# Patient Record
Sex: Female | Born: 1950 | Race: White | Hispanic: No | Marital: Married | Smoking: Never smoker
Health system: Southern US, Community
[De-identification: ages and names within clinical notes are randomized; demographics above are authoritative.]

## PROBLEM LIST (undated history)

## (undated) DIAGNOSIS — I5189 Other ill-defined heart diseases: Secondary | ICD-10-CM

## (undated) DIAGNOSIS — D6851 Activated protein C resistance: Secondary | ICD-10-CM

## (undated) DIAGNOSIS — I10 Essential (primary) hypertension: Secondary | ICD-10-CM

## (undated) DIAGNOSIS — G43909 Migraine, unspecified, not intractable, without status migrainosus: Secondary | ICD-10-CM

## (undated) DIAGNOSIS — R911 Solitary pulmonary nodule: Secondary | ICD-10-CM

## (undated) DIAGNOSIS — Z0389 Encounter for observation for other suspected diseases and conditions ruled out: Secondary | ICD-10-CM

## (undated) DIAGNOSIS — IMO0001 Reserved for inherently not codable concepts without codable children: Secondary | ICD-10-CM

## (undated) DIAGNOSIS — E785 Hyperlipidemia, unspecified: Secondary | ICD-10-CM

## (undated) DIAGNOSIS — M797 Fibromyalgia: Secondary | ICD-10-CM

## (undated) DIAGNOSIS — G2581 Restless legs syndrome: Secondary | ICD-10-CM

## (undated) HISTORY — DX: Solitary pulmonary nodule: R91.1

## (undated) HISTORY — PX: CHOLECYSTECTOMY: SHX55

## (undated) HISTORY — PX: ABDOMINAL SURGERY: SHX537

## (undated) HISTORY — DX: Other ill-defined heart diseases: I51.89

## (undated) HISTORY — DX: Encounter for observation for other suspected diseases and conditions ruled out: Z03.89

## (undated) HISTORY — DX: Migraine, unspecified, not intractable, without status migrainosus: G43.909

## (undated) HISTORY — PX: APPENDECTOMY: SHX54

## (undated) HISTORY — PX: ABDOMINAL HYSTERECTOMY: SHX81

## (undated) HISTORY — DX: Reserved for inherently not codable concepts without codable children: IMO0001

---

## 2015-06-19 ENCOUNTER — Encounter (HOSPITAL_COMMUNITY): Payer: Self-pay | Admitting: *Deleted

## 2015-06-19 ENCOUNTER — Observation Stay (HOSPITAL_COMMUNITY)
Admission: EM | Admit: 2015-06-19 | Discharge: 2015-06-21 | Disposition: A | Discharge status: Home / Self Care | Payer: Medicaid Other | Attending: Internal Medicine | Admitting: Internal Medicine

## 2015-06-19 DIAGNOSIS — R0789 Other chest pain: Secondary | ICD-10-CM

## 2015-06-19 DIAGNOSIS — E785 Hyperlipidemia, unspecified: Secondary | ICD-10-CM | POA: Insufficient documentation

## 2015-06-19 DIAGNOSIS — G2581 Restless legs syndrome: Secondary | ICD-10-CM | POA: Insufficient documentation

## 2015-06-19 DIAGNOSIS — D6851 Activated protein C resistance: Secondary | ICD-10-CM | POA: Diagnosis not present

## 2015-06-19 DIAGNOSIS — E7800 Pure hypercholesterolemia, unspecified: Secondary | ICD-10-CM | POA: Diagnosis not present

## 2015-06-19 DIAGNOSIS — K219 Gastro-esophageal reflux disease without esophagitis: Secondary | ICD-10-CM | POA: Insufficient documentation

## 2015-06-19 DIAGNOSIS — I10 Essential (primary) hypertension: Secondary | ICD-10-CM | POA: Insufficient documentation

## 2015-06-19 DIAGNOSIS — Z8249 Family history of ischemic heart disease and other diseases of the circulatory system: Secondary | ICD-10-CM | POA: Insufficient documentation

## 2015-06-19 DIAGNOSIS — M797 Fibromyalgia: Secondary | ICD-10-CM | POA: Diagnosis not present

## 2015-06-19 DIAGNOSIS — G609 Hereditary and idiopathic neuropathy, unspecified: Secondary | ICD-10-CM | POA: Insufficient documentation

## 2015-06-19 DIAGNOSIS — R55 Syncope and collapse: Secondary | ICD-10-CM | POA: Insufficient documentation

## 2015-06-19 DIAGNOSIS — Z79899 Other long term (current) drug therapy: Secondary | ICD-10-CM | POA: Insufficient documentation

## 2015-06-19 DIAGNOSIS — R911 Solitary pulmonary nodule: Secondary | ICD-10-CM | POA: Insufficient documentation

## 2015-06-19 DIAGNOSIS — R079 Chest pain, unspecified: Secondary | ICD-10-CM | POA: Diagnosis present

## 2015-06-19 DIAGNOSIS — R072 Precordial pain: Principal | ICD-10-CM | POA: Insufficient documentation

## 2015-06-19 DIAGNOSIS — G629 Polyneuropathy, unspecified: Secondary | ICD-10-CM | POA: Insufficient documentation

## 2015-06-19 HISTORY — DX: Fibromyalgia: M79.7

## 2015-06-19 HISTORY — DX: Hyperlipidemia, unspecified: E78.5

## 2015-06-19 HISTORY — DX: Essential (primary) hypertension: I10

## 2015-06-19 HISTORY — DX: Restless legs syndrome: G25.81

## 2015-06-19 HISTORY — DX: Activated protein C resistance: D68.51

## 2015-06-19 LAB — CBC
HCT: 37.4 % (ref 36.0–46.0)
HEMOGLOBIN: 13 g/dL (ref 12.0–15.0)
MCH: 31.3 pg (ref 26.0–34.0)
MCHC: 34.8 g/dL (ref 30.0–36.0)
MCV: 89.9 fL (ref 78.0–100.0)
PLATELETS: 160 10*3/uL (ref 150–400)
RBC: 4.16 MIL/uL (ref 3.87–5.11)
RDW: 12.7 % (ref 11.5–15.5)
WBC: 5.8 10*3/uL (ref 4.0–10.5)

## 2015-06-19 LAB — I-STAT TROPONIN, ED: TROPONIN I, POC: 0.01 ng/mL (ref 0.00–0.08)

## 2015-06-19 NOTE — ED Provider Notes (Addendum)
CSN: [id number]     Arrival date & time 06/19/15  2309 History  By signing my name below, I, Arianna Nassar, attest that this documentation has been prepared under the direction and in the presence of Shon Baton, MD. Electronically Signed: Octavia Heir, ED Scribe. 06/19/2015. 12:00 AM.    Chief Complaint  Patient presents with  . Chest Pain      The history is provided by the patient and the EMS personnel.   HPI Comments: Brenda Terry is a 65 y.o. female with a hx of HTN, high cholesterol brought in by ambulance, who presents to the Emergency Department complaining of intermittent, gradual worsening, pressure-like chest pain with associated nausea and headache for the past two months. Pt notes having left sided neck pain that radiated to her left shoulder with associated tingling in lips and severe chest tightness. She states she loss consciousness, fell to the ground and woke up to being in the ambulance. Pt did hit her head on the floor. She received Nitro via EMS which she states alleviated her symptoms minimally. She reports that walking up the steps increases her chest pain. Pt notes that she has had similar episodes in the past with the same symptoms. She has a family hx of MI at young ages on both mother and fathers side of the family. Pt denies weakness, tingling, numbness, and hx of heart disease.  Past Medical History  Diagnosis Date  . Fibromyalgia   . Restless leg syndrome   . Factor V Leiden (HCC)   . Hypertension   . HLD (hyperlipidemia)    Past Surgical History  Procedure Laterality Date  . Cesarean section    . Appendectomy    . Cholecystectomy    . Abdominal surgery    . Abdominal hysterectomy     No family history on file. Social History  Substance Use Topics  . Smoking status: Never Smoker   . Smokeless tobacco: None  . Alcohol Use: No   OB History    No data available     Review of Systems  Constitutional: Negative for fever.  Respiratory:  Negative for cough and shortness of breath.   Cardiovascular: Positive for chest pain.  Neurological: Positive for syncope and headaches. Negative for weakness and numbness.  All other systems reviewed and are negative.     Allergies  Depakote  Home Medications   Prior to Admission medications   Not on File   Triage vitals: BP 149/93 mmHg  Pulse 75  Temp(Src) 98 F (36.7 C)  Resp 16  SpO2 100% Physical Exam  Constitutional: She is oriented to person, place, and time. She appears well-developed and well-nourished. No distress.  HENT:  Head: Normocephalic and atraumatic.  Cardiovascular: Normal rate, regular rhythm and normal heart sounds.   No murmur heard. Pulmonary/Chest: Effort normal and breath sounds normal. No respiratory distress. She has no wheezes.  Abdominal: Soft. Bowel sounds are normal. There is no tenderness. There is no rebound.  Musculoskeletal: She exhibits no edema.  Neurological: She is alert and oriented to person, place, and time.  Cranial nerves II through 12 intact No dysmetria to finger-nose-finger  Skin: Skin is warm and dry.  Psychiatric: She has a normal mood and affect.  Nursing note and vitals reviewed.   ED Course  Procedures  DIAGNOSTIC STUDIES: Oxygen Saturation is 100% on RA, normal by my interpretation.  COORDINATION OF CARE:  11:57 PM Discussed treatment plan which includes chest x-ray and lab work with  pt at bedside and pt agreed to plan.  Labs Review Labs Reviewed  BASIC METABOLIC PANEL - Abnormal; Notable for the following:    Glucose, Bld 121 (*)    All other components within normal limits  CBC  TROPONIN I  TROPONIN I  TROPONIN I  I-STAT TROPOININ, ED    Imaging Review Ct Angio Head W/cm &/or Wo Cm  06/20/2015  CLINICAL DATA:  Initial evaluation for acute syncopal episode, severe left neck and back pain. EXAM: CT ANGIOGRAPHY HEAD AND NECK TECHNIQUE: Multidetector CT imaging of the head and neck was performed using the  standard protocol during bolus administration of intravenous contrast. Multiplanar CT image reconstructions and MIPs were obtained to evaluate the vascular anatomy. Carotid stenosis measurements (when applicable) are obtained utilizing NASCET criteria, using the distal internal carotid diameter as the denominator. CONTRAST:  50mL OMNIPAQUE IOHEXOL 350 MG/ML SOLN COMPARISON:  None. FINDINGS: CT HEAD There is no acute intracranial hemorrhage or infarct. No mass lesion or midline shift. Gray-white matter differentiation is well maintained. Ventricles are normal in size without evidence of hydrocephalus. CSF containing spaces are within normal limits. No extra-axial fluid collection. The calvarium is intact. Orbital soft tissues are within normal limits. The paranasal sinuses and mastoid air cells are well pneumatized and free of fluid. Scalp soft tissues are unremarkable. CTA NECK Aortic arch: Visualized aortic arch is of normal caliber with normal 3 vessel morphology. Minimal atheromatous plaque present within the arch itself. No high-grade stenosis at the origin of the great vessels. Visualized subclavian arteries widely patent. Right carotid system: Right common carotid artery widely patent from its origin to the bifurcation. Right ICA widely patent from the bifurcation to the skullbase. No evidence for stenosis, dissection, or vascular occlusion within the right carotid artery system. Left carotid system: Left common carotid artery widely patent from its origin to the bifurcation. Mild narrowing of the proximal left ICA at its origin at the bifurcation with associated stenosis of approximately 30-40% by NASCET criteria. Left ICA otherwise widely patent to the skullbase. No evidence for dissection, significant stenosis, or vascular occlusion within the left carotid artery system. Vertebral arteries:Both vertebral arteries arise from the subclavian arteries. Minimal plaque within the pre foraminal right V1 segment  without significant stenosis. Vertebral arteries widely patent to the skullbase without evidence for stenosis, dissection, or occlusion. Skeleton: No acute osseous abnormality. No worrisome lytic or blastic osseous lesions. Moderate degenerative spondylolysis at C6-7. Other neck: Visualized lungs are demonstrate no acute process. 3 mm nodule at the left lung apex (series 501, image 10). Visualized superior mediastinum within normal limits. Thyroid gland normal. No acute soft tissue abnormality within the neck. No adenopathy. CTA HEAD Anterior circulation: The petrous, cavernous, and supraclinoid segments of the ICAs are widely patent. Minimal plaque within the carotid siphons bilaterally. A1 segments, anterior communicating artery, and anterior cerebral arteries well opacified. M1 segments widely patent without stenosis or occlusion. MCA bifurcations normal. Distal MCA branches well opacified and symmetric. Posterior circulation: Vertebral arteries patent to the vertebrobasilar junction. Left vertebral artery slightly dominant. Posterior inferior cerebellar arteries patent bilaterally. Basilar artery mildly tortuous but widely patent. Superior cerebellar and posterior cerebral arteries well opacified and normal in appearance. Venous sinuses: Patent without evidence for venous sinus thrombosis. Anatomic variants: No anatomic variant.  No aneurysm. Delayed phase: No abnormal enhancement on delayed sequence. IMPRESSION: CTA NECK IMPRESSION: 1. No acute abnormality within the major arterial vasculature of the neck. No evidence for dissection. 2. 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 3. Otherwise normal CTA of the neck. No other focal stenosis identified. 4. 3 mm left upper lobe pulmonary nodule, indeterminate. If the patient is at high risk for bronchogenic carcinoma, follow-up chest CT at 1 year is recommended. If the patient is at low risk, no follow-up is needed. This  recommendation follows the consensus statement: Guidelines for Management of Small Pulmonary Nodules Detected on CT Scans: A Statement from the Fleischner Society as published in Radiology 2005; 237:395-400. CTA HEAD IMPRESSION: 1. No acute intracranial process. 2. Normal CTA of the brain. Electronically Signed   By: Rise Mu M.D.   On: 06/20/2015 03:11   Dg Chest 2 View  06/20/2015  CLINICAL DATA:  65 year old female with chest pain and syncope EXAM: CHEST  2 VIEW COMPARISON:  Radiograph dated 10/15/2014 FINDINGS: Two views of the chest demonstrate linear left lung base atelectasis/ scarring. No focal consolidation, pleural effusion, or pneumothorax. The cardiac silhouette is within normal limits. The osseous structures appear unremarkable. IMPRESSION: No active cardiopulmonary disease. Electronically Signed   By: Elgie Collard M.D.   On: 06/20/2015 01:25   Ct Angio Neck W/cm &/or Wo/cm  06/20/2015  CLINICAL DATA:  Initial evaluation for acute syncopal episode, severe left neck and back pain. EXAM: CT ANGIOGRAPHY HEAD AND NECK TECHNIQUE: Multidetector CT imaging of the head and neck was performed using the standard protocol during bolus administration of intravenous contrast. Multiplanar CT image reconstructions and MIPs were obtained to evaluate the vascular anatomy. Carotid stenosis measurements (when applicable) are obtained utilizing NASCET criteria, using the distal internal carotid diameter as the denominator. CONTRAST:  50mL OMNIPAQUE IOHEXOL 350 MG/ML SOLN COMPARISON:  None. FINDINGS: CT HEAD There is no acute intracranial hemorrhage or infarct. No mass lesion or midline shift. Gray-white matter differentiation is well maintained. Ventricles are normal in size without evidence of hydrocephalus. CSF containing spaces are within normal limits. No extra-axial fluid collection. The calvarium is intact. Orbital soft tissues are within normal limits. The paranasal sinuses and mastoid air  cells are well pneumatized and free of fluid. Scalp soft tissues are unremarkable. CTA NECK Aortic arch: Visualized aortic arch is of normal caliber with normal 3 vessel morphology. Minimal atheromatous plaque present within the arch itself. No high-grade stenosis at the origin of the great vessels. Visualized subclavian arteries widely patent. Right carotid system: Right common carotid artery widely patent from its origin to the bifurcation. Right ICA widely patent from the bifurcation to the skullbase. No evidence for stenosis, dissection, or vascular occlusion within the right carotid artery system. Left carotid system: Left common carotid artery widely patent from its origin to the bifurcation. Mild narrowing of the proximal left ICA at its origin at the bifurcation with associated stenosis of approximately 30-40% by NASCET criteria. Left ICA otherwise widely patent to the skullbase. No evidence for dissection, significant stenosis, or vascular occlusion within the left carotid artery system. Vertebral arteries:Both vertebral arteries arise from the subclavian arteries. Minimal plaque within the pre foraminal right V1 segment without significant stenosis. Vertebral arteries widely patent to the skullbase without evidence for stenosis, dissection, or occlusion. Skeleton: No acute osseous abnormality. No worrisome lytic or blastic osseous lesions. Moderate degenerative spondylolysis at C6-7. Other neck: Visualized lungs are demonstrate no acute process. 3 mm nodule at the left lung apex (series 501, image 10). Visualized superior mediastinum within normal limits. Thyroid gland normal. No acute soft tissue abnormality within the neck. No adenopathy. CTA HEAD  Anterior circulation: The petrous, cavernous, and supraclinoid segments of the ICAs are widely patent. Minimal plaque within the carotid siphons bilaterally. A1 segments, anterior communicating artery, and anterior cerebral arteries well opacified. M1 segments  widely patent without stenosis or occlusion. MCA bifurcations normal. Distal MCA branches well opacified and symmetric. Posterior circulation: Vertebral arteries patent to the vertebrobasilar junction. Left vertebral artery slightly dominant. Posterior inferior cerebellar arteries patent bilaterally. Basilar artery mildly tortuous but widely patent. Superior cerebellar and posterior cerebral arteries well opacified and normal in appearance. Venous sinuses: Patent without evidence for venous sinus thrombosis. Anatomic variants: No anatomic variant.  No aneurysm. Delayed phase: No abnormal enhancement on delayed sequence. IMPRESSION: CTA NECK IMPRESSION: 1. No acute abnormality within the major arterial vasculature of the neck. No evidence for dissection. 2. 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 3. Otherwise normal CTA of the neck. No other focal stenosis identified. 4. 3 mm left upper lobe pulmonary nodule, indeterminate. If the patient is at high risk for bronchogenic carcinoma, follow-up chest CT at 1 year is recommended. If the patient is at low risk, no follow-up is needed. This recommendation follows the consensus statement: Guidelines for Management of Small Pulmonary Nodules Detected on CT Scans: A Statement from the Fleischner Society as published in Radiology 2005; 237:395-400. CTA HEAD IMPRESSION: 1. No acute intracranial process. 2. Normal CTA of the brain. Electronically Signed   By: Rise Mu M.D.   On: 06/20/2015 03:11   I have personally reviewed and evaluated these images and lab results as part of my medical decision-making.   EKG Interpretation   Date/Time:  Thursday June 19 2015 23:17:15 EST Ventricular Rate:  78 PR Interval:  177 QRS Duration: 96 QT Interval:  390 QTC Calculation: 444 R Axis:   22 Text Interpretation:  Sinus rhythm Confirmed by Mcclellan Demarais  MD, Toni Amend  ([id number]) on 06/19/2015 11:25:07 PM Also confirmed by Wilkie Aye   MD, Brooks Stotz  ([id number]), editor [place]  CCT, BEVERLY ([id number])  on 06/20/2015 7:41:29 AM      MDM   Final diagnoses:  Other chest pain  Syncope, unspecified syncope type   Patient presents with chest pain and syncope. Also reports left-sided neck pain. Reports several month history of exertional symptoms. Has risk factors for ACS. Currently with dull chest pain. Patient was given nitroglycerin. Reports contraindication to aspirin. Exam is largely reassuring. Nonfocal.I am concerned given syncope and neck pain for possible dissection. EKG is no acute ischemia. Initial troponin is negative. Patient reports improvement of symptoms with nitroglycerin paste applied. She is reporting headache. She was given hydrocodone. CTangiographic the head and neck negative for dissection. 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  Given syncope and concerning story with recent history of exertional chest pain,will admit for chest pain rule out.  I personally performed the services described in this documentation, which was scribed in my presence. The recorded information has been reviewed and is accurate.    Shon Baton, MD 06/20/15 [id number]  Shon Baton, MD 06/20/15 1816

## 2015-06-19 NOTE — ED Notes (Addendum)
Pt arrives via EMS from home. Pt states she has been having CP (pressure) intermittently x 2 months. Denies facilitating factors. Pt was on the phone with daughter and passed out, c/o CP at the same time. Radiates to left shoulder and neck. Pt fell after loosing consciousness. Hit head on floor. States her shoulder and neck pain came prior to fall. EMS gave 1 NTG with relief of CP. Pt states that she has factor 5 liden and cannot take aspirin. 20G RAC.

## 2015-06-20 ENCOUNTER — Encounter (HOSPITAL_COMMUNITY)
Admission: EM | Disposition: A | Discharge status: Home / Self Care | Payer: Self-pay | Source: Home / Self Care | Attending: Emergency Medicine

## 2015-06-20 ENCOUNTER — Emergency Department (HOSPITAL_COMMUNITY): Payer: Medicaid Other

## 2015-06-20 ENCOUNTER — Encounter (HOSPITAL_COMMUNITY): Payer: Self-pay

## 2015-06-20 DIAGNOSIS — R072 Precordial pain: Secondary | ICD-10-CM

## 2015-06-20 DIAGNOSIS — R55 Syncope and collapse: Secondary | ICD-10-CM | POA: Insufficient documentation

## 2015-06-20 DIAGNOSIS — E785 Hyperlipidemia, unspecified: Secondary | ICD-10-CM

## 2015-06-20 DIAGNOSIS — R079 Chest pain, unspecified: Secondary | ICD-10-CM | POA: Diagnosis present

## 2015-06-20 DIAGNOSIS — I10 Essential (primary) hypertension: Secondary | ICD-10-CM

## 2015-06-20 HISTORY — PX: CARDIAC CATHETERIZATION: SHX172

## 2015-06-20 LAB — CREATININE, SERUM: Creatinine, Ser: 0.68 mg/dL (ref 0.44–1.00)

## 2015-06-20 LAB — CBC
HCT: 37.7 % (ref 36.0–46.0)
Hemoglobin: 13.4 g/dL (ref 12.0–15.0)
MCH: 31.8 pg (ref 26.0–34.0)
MCHC: 35.5 g/dL (ref 30.0–36.0)
MCV: 89.5 fL (ref 78.0–100.0)
PLATELETS: 121 10*3/uL — AB (ref 150–400)
RBC: 4.21 MIL/uL (ref 3.87–5.11)
RDW: 12.7 % (ref 11.5–15.5)
WBC: 6.3 10*3/uL (ref 4.0–10.5)

## 2015-06-20 LAB — PROTIME-INR
INR: 1 (ref 0.00–1.49)
PROTHROMBIN TIME: 13.4 s (ref 11.6–15.2)

## 2015-06-20 LAB — BASIC METABOLIC PANEL
Anion gap: 10 (ref 5–15)
BUN: 14 mg/dL (ref 6–20)
CALCIUM: 8.9 mg/dL (ref 8.9–10.3)
CO2: 23 mmol/L (ref 22–32)
CREATININE: 0.73 mg/dL (ref 0.44–1.00)
Chloride: 108 mmol/L (ref 101–111)
GFR calc Af Amer: >60 mL/min (ref >60)
GLUCOSE: 121 mg/dL — AB (ref 65–99)
Potassium: 4.2 mmol/L (ref 3.5–5.1)
Sodium: 141 mmol/L (ref 135–145)

## 2015-06-20 LAB — VITAMIN B12: VITAMIN B 12: 344 pg/mL (ref 180–914)

## 2015-06-20 LAB — TROPONIN I: Troponin I: <0.03 ng/mL (ref <0.031)

## 2015-06-20 LAB — TSH: TSH: 0.882 u[IU]/mL (ref 0.350–4.500)

## 2015-06-20 LAB — D-DIMER, QUANTITATIVE (NOT AT ARMC): D DIMER QUANT: 0.72 ug{FEU}/mL — AB (ref 0.00–0.50)

## 2015-06-20 SURGERY — LEFT HEART CATH AND CORONARY ANGIOGRAPHY
Anesthesia: LOCAL

## 2015-06-20 MED ORDER — SODIUM CHLORIDE 0.9 % IJ SOLN
3.0000 mL | Freq: Two times a day (BID) | INTRAMUSCULAR | Status: DC
  Administered 2015-06-20: 3 mL via INTRAVENOUS

## 2015-06-20 MED ORDER — IOHEXOL 350 MG/ML SOLN
INTRAVENOUS | Status: DC | PRN
  Administered 2015-06-20: 60 mL via INTRA_ARTERIAL

## 2015-06-20 MED ORDER — MIDAZOLAM HCL 2 MG/2ML IJ SOLN
INTRAMUSCULAR | Status: DC | PRN
Start: 2015-06-20 — End: 2015-06-20
  Administered 2015-06-20: 1 mg via INTRAVENOUS
  Administered 2015-06-20: 2 mg via INTRAVENOUS

## 2015-06-20 MED ORDER — ASPIRIN 325 MG PO TABS
325.0000 mg | ORAL_TABLET | Freq: Once | ORAL | Status: AC
  Administered 2015-06-20: 325 mg via ORAL
  Filled 2015-06-20: qty 1

## 2015-06-20 MED ORDER — OXYCODONE-ACETAMINOPHEN 5-325 MG PO TABS
1.0000 | ORAL_TABLET | Freq: Once | ORAL | Status: AC
  Administered 2015-06-20: 1 via ORAL
  Filled 2015-06-20: qty 1

## 2015-06-20 MED ORDER — FENTANYL CITRATE (PF) 100 MCG/2ML IJ SOLN
INTRAMUSCULAR | Status: DC | PRN
  Administered 2015-06-20 (×2): 25 ug via INTRAVENOUS

## 2015-06-20 MED ORDER — ENOXAPARIN SODIUM 40 MG/0.4ML ~~LOC~~ SOLN: 40.0000 mg | SUBCUTANEOUS | Status: DC

## 2015-06-20 MED ORDER — INFLUENZA VAC SPLIT QUAD 0.5 ML IM SUSY
0.5000 mL | PREFILLED_SYRINGE | INTRAMUSCULAR | Status: DC
Start: 2015-06-21 — End: 2015-06-21

## 2015-06-20 MED ORDER — SODIUM CHLORIDE 0.9 % IV SOLN: 250.0000 mL | INTRAVENOUS | Status: DC | PRN

## 2015-06-20 MED ORDER — SODIUM CHLORIDE 0.9 % IJ SOLN: 3.0000 mL | INTRAMUSCULAR | Status: DC | PRN

## 2015-06-20 MED ORDER — OXYCODONE-ACETAMINOPHEN 5-325 MG PO TABS
1.0000 | ORAL_TABLET | ORAL | Status: DC | PRN
  Administered 2015-06-21 (×2): 1 via ORAL
  Filled 2015-06-20 (×2): qty 1

## 2015-06-20 MED ORDER — HEPARIN (PORCINE) IN NACL 2-0.9 UNIT/ML-% IJ SOLN
INTRAMUSCULAR | Status: AC
  Filled 2015-06-20: qty 1000

## 2015-06-20 MED ORDER — MIDAZOLAM HCL 2 MG/2ML IJ SOLN
INTRAMUSCULAR | Status: AC
  Filled 2015-06-20: qty 2

## 2015-06-20 MED ORDER — LIDOCAINE HCL (PF) 1 % IJ SOLN
INTRAMUSCULAR | Status: DC | PRN
  Administered 2015-06-20: 17:00:00

## 2015-06-20 MED ORDER — OXYCODONE-ACETAMINOPHEN 5-325 MG PO TABS
1.0000 | ORAL_TABLET | Freq: Four times a day (QID) | ORAL | Status: DC | PRN
  Administered 2015-06-20 (×2): 1 via ORAL
  Filled 2015-06-20 (×2): qty 1

## 2015-06-20 MED ORDER — VERAPAMIL HCL 2.5 MG/ML IV SOLN
INTRAVENOUS | Status: AC
Start: 2015-06-20 — End: 2015-06-20
  Filled 2015-06-20: qty 2

## 2015-06-20 MED ORDER — NITROGLYCERIN 2 % TD OINT
1.0000 [in_us] | TOPICAL_OINTMENT | Freq: Once | TRANSDERMAL | Status: AC
  Administered 2015-06-20: 1 [in_us] via TOPICAL
  Filled 2015-06-20: qty 1

## 2015-06-20 MED ORDER — FENTANYL CITRATE (PF) 100 MCG/2ML IJ SOLN
INTRAMUSCULAR | Status: AC
  Filled 2015-06-20: qty 2

## 2015-06-20 MED ORDER — IOHEXOL 350 MG/ML SOLN
80.0000 mL | Freq: Once | INTRAVENOUS | Status: AC | PRN
  Administered 2015-06-20: 50 mL via INTRAVENOUS

## 2015-06-20 MED ORDER — PROMETHAZINE HCL 25 MG/ML IJ SOLN
12.5000 mg | Freq: Once | INTRAMUSCULAR | Status: AC
  Administered 2015-06-20: 12.5 mg via INTRAVENOUS
  Filled 2015-06-20: qty 1

## 2015-06-20 MED ORDER — HEPARIN SODIUM (PORCINE) 1000 UNIT/ML IJ SOLN
INTRAMUSCULAR | Status: DC | PRN
  Administered 2015-06-20: 4000 [IU] via INTRAVENOUS

## 2015-06-20 MED ORDER — ONDANSETRON HCL 4 MG/2ML IJ SOLN: 4.0000 mg | Freq: Four times a day (QID) | INTRAMUSCULAR | Status: DC | PRN

## 2015-06-20 MED ORDER — SODIUM CHLORIDE 0.9 % IJ SOLN: 3.0000 mL | Freq: Two times a day (BID) | INTRAMUSCULAR | Status: DC

## 2015-06-20 MED ORDER — ASPIRIN 81 MG PO CHEW
81.0000 mg | CHEWABLE_TABLET | ORAL | Status: AC
  Administered 2015-06-20: 81 mg via ORAL
  Filled 2015-06-20: qty 1

## 2015-06-20 MED ORDER — ACETAMINOPHEN 325 MG PO TABS
650.0000 mg | ORAL_TABLET | ORAL | Status: DC | PRN
Start: 2015-06-20 — End: 2015-06-21

## 2015-06-20 MED ORDER — GABAPENTIN 100 MG PO CAPS
200.0000 mg | ORAL_CAPSULE | Freq: Every day | ORAL | Status: DC
Start: 2015-06-20 — End: 2015-06-21
  Administered 2015-06-20: 200 mg via ORAL
  Filled 2015-06-20 (×2): qty 2

## 2015-06-20 MED ORDER — SODIUM CHLORIDE 0.9 % WEIGHT BASED INFUSION: 1.0000 mL/kg/h | INTRAVENOUS | Status: AC

## 2015-06-20 MED ORDER — ONDANSETRON HCL 4 MG/2ML IJ SOLN: 4.0000 mg | Freq: Once | INTRAMUSCULAR | Status: AC

## 2015-06-20 MED ORDER — VERAPAMIL HCL 2.5 MG/ML IV SOLN
INTRAVENOUS | Status: DC | PRN
  Administered 2015-06-20: 17:00:00 via INTRA_ARTERIAL

## 2015-06-20 MED ORDER — SODIUM CHLORIDE 0.9 % IV SOLN: INTRAVENOUS | Status: DC

## 2015-06-20 MED ORDER — ONDANSETRON HCL 4 MG/2ML IJ SOLN
4.0000 mg | Freq: Four times a day (QID) | INTRAMUSCULAR | Status: DC | PRN
  Administered 2015-06-20: 4 mg via INTRAVENOUS
  Filled 2015-06-20: qty 2

## 2015-06-20 MED ORDER — LIDOCAINE HCL (PF) 1 % IJ SOLN
INTRAMUSCULAR | Status: AC
Start: 2015-06-20 — End: 2015-06-20
  Filled 2015-06-20: qty 30

## 2015-06-20 MED ORDER — HEPARIN SODIUM (PORCINE) 1000 UNIT/ML IJ SOLN
INTRAMUSCULAR | Status: AC
Start: 2015-06-20 — End: 2015-06-20
  Filled 2015-06-20: qty 1

## 2015-06-20 MED ORDER — ENOXAPARIN SODIUM 40 MG/0.4ML ~~LOC~~ SOLN
40.0000 mg | SUBCUTANEOUS | Status: DC
  Administered 2015-06-21: 40 mg via SUBCUTANEOUS
  Filled 2015-06-20 (×3): qty 0.4

## 2015-06-20 MED ORDER — ACETAMINOPHEN 325 MG PO TABS: 650.0000 mg | ORAL_TABLET | ORAL | Status: DC | PRN

## 2015-06-20 SURGICAL SUPPLY — 13 items
CATH IMPULSE 5F ANG/FL3.5 (CATHETERS) ×2 IMPLANT
CATH INFINITI 5 FR JL3.5 (CATHETERS) ×2 IMPLANT
CATH INFINITI 5FR ANG PIGTAIL (CATHETERS) ×2 IMPLANT
CATH INFINITI JR4 5F (CATHETERS) ×2 IMPLANT
DEVICE RAD COMP TR BAND LRG (VASCULAR PRODUCTS) ×2 IMPLANT
GLIDESHEATH SLEND SS 6F .021 (SHEATH) ×2 IMPLANT
KIT HEART LEFT (KITS) ×2 IMPLANT
PACK CARDIAC CATHETERIZATION (CUSTOM PROCEDURE TRAY) ×2 IMPLANT
SYR MEDRAD MARK V 150ML (SYRINGE) ×2 IMPLANT
TRANSDUCER W/STOPCOCK (MISCELLANEOUS) ×2 IMPLANT
TUBING CIL FLEX 10 FLL-RA (TUBING) ×2 IMPLANT
WIRE HI TORQ VERSACORE-J 145CM (WIRE) ×2 IMPLANT
WIRE SAFE-T 1.5MM-J .035X260CM (WIRE) ×2 IMPLANT

## 2015-06-20 NOTE — H&P (View-Only) (Signed)
CARDIOLOGY CONSULT NOTE   Patient ID: Brenda Terry MRN: [id number], DOB/AGE: 63/06/1950   Admit date: 06/19/2015 Date of Consult: 06/20/2015 Reason for Consult: Chest Pain Physician Requesting Consult: Dr. Gwenlyn PerkingMadera   Primary Physician: No PCP Per Patient Primary Cardiologist: New  HPI: Brenda Terry is a 65 y.o. female with past medical history of HTN, HLD, Factor V Leiden, Fibromyalgia, and significant family history of CAD who presented to [facility] on 06/19/2015 for evaluation of chest pressure.  The patient reports having intermittent chest pressure for the past two months which radiates to the shoulder and neck. The pressure comes on with activity and is now more frequent, even occurring when climbing the stairs in her home. She reports associated dyspnea with the pressure.  Last night, she developed chest pressure and had 10/10 pain, radiating to her neck and down her left arm. She was diaphoretic at the time. She reports the pain was so severe she lost consciousness while talking on the phone with her daughter. She denies any presyncopal lightheadedness, dizziness, or vision changes. EMS was called and she was [facility]. She was given SL NTG en route as well as on a NTG patch in the ED and reports having relief with her symptoms. The NTG patch did cause her to have a severe headache.  While admitted, her initial troponin values have been negative. Creatinine stable at 0.73. Platelets at 160. EKG shows NSR, HR 78, no acute ST or T-wave changes. CXR showed no acute findings. CTA showed no evidence of dissection. A 3mm left upper lobe pulmonary nodule was noted.   She reports having a NST and cardiac catheterization 7-10 years ago which she believes was "normal". Did not require PCI or stent placement at that time.  As for her Factor V Leiden, she was on Coumadin in the past but was instructed to stop taking it by her hematologist. She denies any history of DVT's or  PE.  She also reports a strong history of CAD. Her father passed away from a MI at age 63 and her maternal grandmother passed away in her 7960's from an MI. Her brother is currently living but had a MI in his 5540's. She reports her father had a stress test and normal EKG around the time of his MI and she prefers not to have a NST.   Problem List Past Medical History  Diagnosis Date  . Restless leg syndrome   . Factor V Leiden (HCC)   . Hypertension   . HLD (hyperlipidemia)   . Fibromyalgia     Past Surgical History  Procedure Laterality Date  . Cesarean section    . Appendectomy    . Cholecystectomy    . Abdominal surgery    . Abdominal hysterectomy       Allergies Allergies  Allergen Reactions  . Depakote [Divalproex Sodium]     Makes pt have seizures  . Cyclobenzaprine Itching      Inpatient Medications . enoxaparin (LOVENOX) injection  40 mg Subcutaneous Q24H  . gabapentin  200 mg Oral QHS  . [START ON 06/21/2015] Influenza vac split quadrivalent PF  0.5 mL Intramuscular Tomorrow-1000    Family History Family History  Problem Relation Age of Onset  . Heart attack Father [id number]    Deceased  . Heart attack Brother 40  . Heart attack Maternal Grandmother 360    Deceased     Social History Social History   Social History  . Marital Status:  Married    Spouse Name: N/A  . Number of Children: N/A  . Years of Education: N/A   Occupational History  . Not on file.   Social History Main Topics  . Smoking status: Never Smoker   . Smokeless tobacco: Not on file  . Alcohol Use: No  . Drug Use: No  . Sexual Activity: Not on file   Other Topics Concern  . Not on file   Social History Narrative     Review of Systems General:  No chills, fever, night sweats or weight changes.  Cardiovascular:  No edema, orthopnea, palpitations, paroxysmal nocturnal dyspnea. Positive for chest pain and dyspnea on exertion. Dermatological: No rash, lesions/masses Respiratory: No  cough, Positive for dyspnea Urologic: No hematuria, dysuria Abdominal:   No diarrhea, bright red blood per rectum, melena, or hematemesis. Positive for nausea and vomiting. Neurologic:  No visual changes, wkns, changes in mental status. All other systems reviewed and are otherwise negative except as noted above.  Physical Exam  Blood pressure 115/75, pulse 65, temperature 97.4 F (36.3 C), temperature source Oral, resp. rate 18, SpO2 98 %.  General: Pleasant, Caucasian female appearing in NAD Psych: Normal affect. Neuro: Alert and oriented X 3. Moves all extremities spontaneously. HEENT: Normal  Neck: Supple without bruits or JVD. Lungs:  Resp regular and unlabored, CTA without wheezing or rales. Heart: RRR no s3, s4, or murmurs. Abdomen: Soft, non-tender, non-distended, BS + x 4.  Extremities: No clubbing, cyanosis or edema. DP/PT/Radials 2+ and equal bilaterally.  Labs  Recent Labs  06/20/15 0520 06/20/15 0759  TROPONINI <0.03 <0.03   Lab Results  Component Value Date   WBC 5.8 06/19/2015   HGB 13.0 06/19/2015   HCT 37.4 06/19/2015   MCV 89.9 06/19/2015   PLT 160 06/19/2015     Recent Labs Lab 06/19/15 2329  NA 141  K 4.2  CL 108  CO2 23  BUN 14  CREATININE 0.73  CALCIUM 8.9  GLUCOSE 121*    Radiology/Studies  Dg Chest 2 View: 06/20/2015  CLINICAL DATA:  65 year old female with chest pain and syncope EXAM: CHEST  2 VIEW COMPARISON:  Radiograph dated 10/15/2014 FINDINGS: Two views of the chest demonstrate linear left lung base atelectasis/ scarring. No focal consolidation, pleural effusion, or pneumothorax. The cardiac silhouette is within normal limits. The osseous structures appear unremarkable. IMPRESSION: No active cardiopulmonary disease. Electronically Signed   By: Elgie Collard M.D.   On: 06/20/2015 01:25   Ct Angio Neck W/cm &/or Wo/cm: 06/20/2015  CLINICAL DATA:  Initial evaluation for acute syncopal episode, severe left neck and back pain. EXAM: CT  ANGIOGRAPHY HEAD AND NECK TECHNIQUE: Multidetector CT imaging of the head and neck was performed using the standard protocol during bolus administration of intravenous contrast. Multiplanar CT image reconstructions and MIPs were obtained to evaluate the vascular anatomy. Carotid stenosis measurements (when applicable) are obtained utilizing NASCET criteria, using the distal internal carotid diameter as the denominator. CONTRAST:  50mL OMNIPAQUE IOHEXOL 350 MG/ML SOLN COMPARISON:  None. FINDINGS: CT HEAD There is no acute intracranial hemorrhage or infarct. No mass lesion or midline shift. Gray-white matter differentiation is well maintained. Ventricles are normal in size without evidence of hydrocephalus. CSF containing spaces are within normal limits. No extra-axial fluid collection. The calvarium is intact. Orbital soft tissues are within normal limits. The paranasal sinuses and mastoid air cells are well pneumatized and free of fluid. Scalp soft tissues are unremarkable. CTA NECK Aortic arch: Visualized aortic arch is  of normal caliber with normal 3 vessel morphology. Minimal atheromatous plaque present within the arch itself. No high-grade stenosis at the origin of the great vessels. Visualized subclavian arteries widely patent. Right carotid system: Right common carotid artery widely patent from its origin to the bifurcation. Right ICA widely patent from the bifurcation to the skullbase. No evidence for stenosis, dissection, or vascular occlusion within the right carotid artery system. Left carotid system: Left common carotid artery widely patent from its origin to the bifurcation. Mild narrowing of the proximal left ICA at its origin at the bifurcation with associated stenosis of approximately 30-40% by NASCET criteria. Left ICA otherwise widely patent to the skullbase. No evidence for dissection, significant stenosis, or vascular occlusion within the left carotid artery system. Vertebral arteries:Both  vertebral arteries arise from the subclavian arteries. Minimal plaque within the pre foraminal right V1 segment without significant stenosis. Vertebral arteries widely patent to the skullbase without evidence for stenosis, dissection, or occlusion. Skeleton: No acute osseous abnormality. No worrisome lytic or blastic osseous lesions. Moderate degenerative spondylolysis at C6-7. Other neck: Visualized lungs are demonstrate no acute process. 3 mm nodule at the left lung apex (series 501, image 10). Visualized superior mediastinum within normal limits. Thyroid gland normal. No acute soft tissue abnormality within the neck. No adenopathy. CTA HEAD Anterior circulation: The petrous, cavernous, and supraclinoid segments of the ICAs are widely patent. Minimal plaque within the carotid siphons bilaterally. A1 segments, anterior communicating artery, and anterior cerebral arteries well opacified. M1 segments widely patent without stenosis or occlusion. MCA bifurcations normal. Distal MCA branches well opacified and symmetric. Posterior circulation: Vertebral arteries patent to the vertebrobasilar junction. Left vertebral artery slightly dominant. Posterior inferior cerebellar arteries patent bilaterally. Basilar artery mildly tortuous but widely patent. Superior cerebellar and posterior cerebral arteries well opacified and normal in appearance. Venous sinuses: Patent without evidence for venous sinus thrombosis. Anatomic variants: No anatomic variant.  No aneurysm. Delayed phase: No abnormal enhancement on delayed sequence. IMPRESSION: CTA NECK IMPRESSION: 1. No acute abnormality within the major arterial vasculature of the neck. No evidence for dissection. 2. 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 3. Otherwise normal CTA of the neck. No other focal stenosis identified. 4. 3 mm left upper lobe pulmonary nodule, indeterminate. If the patient is at high risk for bronchogenic  carcinoma, follow-up chest CT at 1 year is recommended. If the patient is at low risk, no follow-up is needed. This recommendation follows the consensus statement: Guidelines for Management of Small Pulmonary Nodules Detected on CT Scans: A Statement from the Fleischner Society as published in Radiology 2005; 237:395-400. CTA HEAD IMPRESSION: 1. No acute intracranial process. 2. Normal CTA of the brain. Electronically Signed   By: Rise Mu M.D.   On: 06/20/2015 03:11    ECG: NSR, HR 78, no acute ST or T-wave changes.   ECHOCARDIOGRAM: None on file   ASSESSMENT AND PLAN  1. Chest Pain with Moderate Risk of Cardiac Etiology - presents with worsening chest pressure over the past two months, accompanied by dyspnea and worse with physical exertion. Developed severe pain last night which was accompanied by diaphoresis. Relieved with SL NTG. - Strong family history of CAD with father having a MI at age 13, MGM in 55's, and brother in his 25's. - initial troponin values negative. EKG without acute ischemic changes. Cyclic values pending. - check Lipid Panel and A1c for risk stratification. - will plan for LHC today with  possible PCI. She has been NPO since yesterday. Pending results of cath, will need to consider initiating low-dose BB and statin.  2. HTN - BP has been 101/62 - 158/95 in the past 24 hours. - was prescribed HCTZ in the past but she has not been taking it regularly. - continue to monitor. Consider addition of low-dose ACE-I if HTN persists.   3. HLD - reports not taking her statin for a long time - will check Lipid Panel for risk stratification.  4. Factor V Leiden Deficiency - denies any history of PE or DVT. Currently not on any anticoagulants. - continue to monitor.  5. Pulmonary Nodule - 3mm left upper lobe pulmonary nodule was noted on CT this admission. - will need to be followed as outpatient.    Signed, Ellsworth Lennox, PA-C 06/20/2015, 2:17  PM Pager: [phone] Agree with assessment and plan.  She has multiple risk factors and her history of chest pain is very suggestive of coronary ischemia.  Her family history is very notable for early coronary death.  Her last cardiac catheterization was 8-10 years ago and at that time she apparently had no significant disease.  She gives a history of factor V Leiden deficiency which is also prevalent in multiple family members.  However, interestingly, normally is on long-term Coumadin including the patient.  The patient denies any prior history of DVT or pulmonary emboli.  Her chest x-ray and EKG personally reviewed and are unremarkable.  She is a nonsmoker. With her clinical presentation and strong family history we will proceed with left heart cardiac catheterization today.The risks and benefits of a cardiac catheterization including, but not limited to, death, stroke, MI, kidney damage and bleeding were discussed with the patient who indicates understanding and agrees to proceed.

## 2015-06-20 NOTE — Care Management Note (Signed)
Case Management Note  Patient Details  Name: Armond HangMable Jessie MRN: [id number] Date of Birth: 01/08/1951  Subjective/Objective:                    Action/Plan:  Pt is from home with husband.  CM verified with financial counselor that pt has active [place] Medicaid   Expected Discharge Date:  06/24/15               Expected Discharge Plan:  Home/Self Care  In-House Referral:  PCP / [facility]  Discharge planning Services  CM Consult  Post Acute Care Choice:    Choice offered to:     DME Arranged:    DME Agency:     HH Arranged:    HH Agency:     Status of Service:  In process, will continue to follow  Medicare Important Message Given:    Date Medicare IM Given:    Medicare IM give by:    Date Additional Medicare IM Given:    Additional Medicare Important Message give by:     If discussed at Long Length of Stay Meetings, dates discussed:    Additional Comments: CM assessed pt with family at bedside.  Pt stated that she is aware that medicaid is active in [place] and prescriptions are $3.  Pt stated [facility] and the medicaid in [place] will not pay for MD nor prescriptions prescribed by [place] doctor, pt stated she has been told that the wait list for PCP in [place] is 3-4 months.  CM offered to set pt up with sickle cell clinic for PCP - pt accepted.   PCP appt at sickle cell clinic arranged for:  07/20/14 at 11:15.  Pt was provided brochure of both sickle cell clinic and [facility] pharmacy with appt specifics.  Cherylann ParrClaxton, Dierre Crevier S, RN 06/20/2015, 2:41 PM

## 2015-06-20 NOTE — ED Notes (Addendum)
Dr. Julian ReilGardner back at the bedside. NTG paste removed per Dr. [place] ServiceGardner's order. Per Dr. Julian ReilGardner if CP comes back may place 0.5 inch of paste back on pt.

## 2015-06-20 NOTE — Progress Notes (Signed)
Patient seen and examined. admitted fater midnight secondayr to CP. Heart score 5-6 and with typical features on her history. Patient is currently pain and her troponin has been negative so far. Discussed with cardiology and the plan is to proceed with left heart cath. Patient also complaining of tingling/numbness in her head/scalp (forehead towards almost occipital area). Not new but worsening.  Plan: -will check TSH and B12 -follow results from heart cath and cardiology rec's -BMET in am -PRN analgesics  -will need outpatient neurology evaluation -outpatient follow up of lung nodule  Brenda Terry, Brenda Terry [phone]

## 2015-06-20 NOTE — ED Notes (Signed)
Pt back from CT

## 2015-06-20 NOTE — ED Notes (Signed)
Lab back at the bedside.  

## 2015-06-20 NOTE — Progress Notes (Signed)
Pt arrived to 2W from ER.  Pt placed on telemetry and CCMD notified.  Pt oriented to room including call light and telephone.  Family at bedside during education.  Pt denies pain at this time.  Will cont to monitor.

## 2015-06-20 NOTE — Interval H&P Note (Signed)
Cath Lab Visit (complete for each Cath Lab visit)  Clinical Evaluation Leading to the Procedure:   ACS: Yes.    Non-ACS:    Anginal Classification: CCS IV  Anti-ischemic medical therapy: No Therapy  Non-Invasive Test Results: No non-invasive testing performed  Prior CABG: No previous CABG      History and Physical Interval Note:  06/20/2015 4:23 PM  Brenda Terry  has presented today for surgery, with the diagnosis of cp  The various methods of treatment have been discussed with the patient and family. After consideration of risks, benefits and other options for treatment, the patient has consented to  Procedure(s): Left Heart Cath and Coronary Angiography (N/A) as a surgical intervention .  The patient's history has been reviewed, patient examined, no change in status, stable for surgery.  I have reviewed the patient's chart and labs.  Questions were answered to the patient's satisfaction.     VARANASI,JAYADEEP S.

## 2015-06-20 NOTE — ED Notes (Signed)
DR. Wilkie AyeHorton back at the bedside.

## 2015-06-20 NOTE — ED Notes (Signed)
Attempted report 

## 2015-06-20 NOTE — Care Management (Signed)
Bedside nurse informed CM that pt stated she does not have active insurance.  CM verified with financial counselor  that pt has active [place] Medicaid.

## 2015-06-20 NOTE — ED Notes (Signed)
Heart healthy lunch tray ordered 

## 2015-06-20 NOTE — H&P (Signed)
[facility] History and Physical  Tinna Brennan ZOX:[id number] DOB: 09/10/1950 DOA: 06/19/2015  Referring physician: EDP PCP: No PCP Per Patient   Chief Complaint: CP, syncope   HPI: Brenda Terry is a 65 y.o. female with h/o HTN, HLD, patient presents to the ED with c/o intermittent episodes of gradually worsening, pressure-like chest pain with associated nausea and headache.  Symptoms have been getting worse for the past 2 months.  Symptoms typically occur with activity, walking up steps makes pain worse.  Pain is left sided with radiation to L shoulder.  Today she had sudden LOC at home, and she woke up in the ambulance with severe L sided chest pain.  CP has been resolved with NTG although this has given her a severe headache in the process.  Review of Systems: Systems reviewed.  As above, otherwise negative  Past Medical History  Diagnosis Date  . Fibromyalgia   . Restless leg syndrome   . Factor V Leiden (HCC)   . Hypertension   . HLD (hyperlipidemia)    Past Surgical History  Procedure Laterality Date  . Cesarean section    . Appendectomy    . Cholecystectomy    . Abdominal surgery    . Abdominal hysterectomy     Social History:  reports that she has never smoked. She does not have any smokeless tobacco history on file. She reports that she does not drink alcohol or use illicit drugs.  Allergies  Allergen Reactions  . Depakote [Divalproex Sodium]     Makes pt have seizures    No family history on file. Family history is positive for early onset MI on both mothers and fathers side of family.  Prior to Admission medications   Not on File   Physical Exam: Filed Vitals:   06/20/15 0315 06/20/15 0333  BP: 130/77   Pulse: 75 76  Temp:    Resp: 21 20    BP 130/77 mmHg  Pulse 76  Temp(Src) 98 F (36.7 C)  Resp 20  SpO2 98%  General Appearance:    Alert, oriented, no distress, appears stated age  Head:    Normocephalic, atraumatic  Eyes:    PERRL, EOMI,  sclera non-icteric        Nose:   Nares without drainage or epistaxis. Mucosa, turbinates normal  Throat:   Moist mucous membranes. Oropharynx without erythema or exudate.  Neck:   Supple. No carotid bruits.  No thyromegaly.  No lymphadenopathy.   Back:     No CVA tenderness, no spinal tenderness  Lungs:     Clear to auscultation bilaterally, without wheezes, rhonchi or rales  Chest wall:    No tenderness to palpitation  Heart:    Regular rate and rhythm without murmurs, gallops, rubs  Abdomen:     Soft, non-tender, nondistended, normal bowel sounds, no organomegaly  Genitalia:    deferred  Rectal:    deferred  Extremities:   No clubbing, cyanosis or edema.  Pulses:   2+ and symmetric all extremities  Skin:   Skin color, texture, turgor normal, no rashes or lesions  Lymph nodes:   Cervical, supraclavicular, and axillary nodes normal  Neurologic:   CNII-XII intact. Normal strength, sensation and reflexes      throughout    Labs on Admission:  Basic Metabolic Panel:  Recent Labs Lab 06/19/15 2329  NA 141  K 4.2  CL 108  CO2 23  GLUCOSE 121*  BUN 14  CREATININE 0.73  CALCIUM 8.9  Liver Function Tests: No results for input(s): AST, ALT, ALKPHOS, BILITOT, PROT, ALBUMIN in the last 168 hours. No results for input(s): LIPASE, AMYLASE in the last 168 hours. No results for input(s): AMMONIA in the last 168 hours. CBC:  Recent Labs Lab 06/19/15 2329  WBC 5.8  HGB 13.0  HCT 37.4  MCV 89.9  PLT 160   Cardiac Enzymes: No results for input(s): CKTOTAL, CKMB, CKMBINDEX, TROPONINI in the last 168 hours.  BNP (last 3 results) No results for input(s): PROBNP in the last 8760 hours. CBG: No results for input(s): GLUCAP in the last 168 hours.  Radiological Exams on Admission: Ct Angio Head W/cm &/or Wo Cm  06/20/2015  CLINICAL DATA:  Initial evaluation for acute syncopal episode, severe left neck and back pain. EXAM: CT ANGIOGRAPHY HEAD AND NECK TECHNIQUE: Multidetector CT  imaging of the head and neck was performed using the standard protocol during bolus administration of intravenous contrast. Multiplanar CT image reconstructions and MIPs were obtained to evaluate the vascular anatomy. Carotid stenosis measurements (when applicable) are obtained utilizing NASCET criteria, using the distal internal carotid diameter as the denominator. CONTRAST:  50mL OMNIPAQUE IOHEXOL 350 MG/ML SOLN COMPARISON:  None. FINDINGS: CT HEAD There is no acute intracranial hemorrhage or infarct. No mass lesion or midline shift. Gray-white matter differentiation is well maintained. Ventricles are normal in size without evidence of hydrocephalus. CSF containing spaces are within normal limits. No extra-axial fluid collection. The calvarium is intact. Orbital soft tissues are within normal limits. The paranasal sinuses and mastoid air cells are well pneumatized and free of fluid. Scalp soft tissues are unremarkable. CTA NECK Aortic arch: Visualized aortic arch is of normal caliber with normal 3 vessel morphology. Minimal atheromatous plaque present within the arch itself. No high-grade stenosis at the origin of the great vessels. Visualized subclavian arteries widely patent. Right carotid system: Right common carotid artery widely patent from its origin to the bifurcation. Right ICA widely patent from the bifurcation to the skullbase. No evidence for stenosis, dissection, or vascular occlusion within the right carotid artery system. Left carotid system: Left common carotid artery widely patent from its origin to the bifurcation. Mild narrowing of the proximal left ICA at its origin at the bifurcation with associated stenosis of approximately 30-40% by NASCET criteria. Left ICA otherwise widely patent to the skullbase. No evidence for dissection, significant stenosis, or vascular occlusion within the left carotid artery system. Vertebral arteries:Both vertebral arteries arise from the subclavian arteries.  Minimal plaque within the pre foraminal right V1 segment without significant stenosis. Vertebral arteries widely patent to the skullbase without evidence for stenosis, dissection, or occlusion. Skeleton: No acute osseous abnormality. No worrisome lytic or blastic osseous lesions. Moderate degenerative spondylolysis at C6-7. Other neck: Visualized lungs are demonstrate no acute process. 3 mm nodule at the left lung apex (series 501, image 10). Visualized superior mediastinum within normal limits. Thyroid gland normal. No acute soft tissue abnormality within the neck. No adenopathy. CTA HEAD Anterior circulation: The petrous, cavernous, and supraclinoid segments of the ICAs are widely patent. Minimal plaque within the carotid siphons bilaterally. A1 segments, anterior communicating artery, and anterior cerebral arteries well opacified. M1 segments widely patent without stenosis or occlusion. MCA bifurcations normal. Distal MCA branches well opacified and symmetric. Posterior circulation: Vertebral arteries patent to the vertebrobasilar junction. Left vertebral artery slightly dominant. Posterior inferior cerebellar arteries patent bilaterally. Basilar artery mildly tortuous but widely patent. Superior cerebellar and posterior cerebral arteries well opacified and normal in appearance.  Venous sinuses: Patent without evidence for venous sinus thrombosis. Anatomic variants: No anatomic variant.  No aneurysm. Delayed phase: No abnormal enhancement on delayed sequence. IMPRESSION: CTA NECK IMPRESSION: 1. No acute abnormality within the major arterial vasculature of the neck. No evidence for dissection. 2. 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 3. Otherwise normal CTA of the neck. No other focal stenosis identified. 4. 3 mm left upper lobe pulmonary nodule, indeterminate. If the patient is at high risk for bronchogenic carcinoma, follow-up chest CT at 1 year is recommended. If the  patient is at low risk, no follow-up is needed. This recommendation follows the consensus statement: Guidelines for Management of Small Pulmonary Nodules Detected on CT Scans: A Statement from the Fleischner Society as published in Radiology 2005; 237:395-400. CTA HEAD IMPRESSION: 1. No acute intracranial process. 2. Normal CTA of the brain. Electronically Signed   By: Rise Mu M.D.   On: 06/20/2015 03:11   Dg Chest 2 View  06/20/2015  CLINICAL DATA:  65 year old female with chest pain and syncope EXAM: CHEST  2 VIEW COMPARISON:  Radiograph dated 10/15/2014 FINDINGS: Two views of the chest demonstrate linear left lung base atelectasis/ scarring. No focal consolidation, pleural effusion, or pneumothorax. The cardiac silhouette is within normal limits. The osseous structures appear unremarkable. IMPRESSION: No active cardiopulmonary disease. Electronically Signed   By: Elgie Collard M.D.   On: 06/20/2015 01:25   Ct Angio Neck W/cm &/or Wo/cm  06/20/2015  CLINICAL DATA:  Initial evaluation for acute syncopal episode, severe left neck and back pain. EXAM: CT ANGIOGRAPHY HEAD AND NECK TECHNIQUE: Multidetector CT imaging of the head and neck was performed using the standard protocol during bolus administration of intravenous contrast. Multiplanar CT image reconstructions and MIPs were obtained to evaluate the vascular anatomy. Carotid stenosis measurements (when applicable) are obtained utilizing NASCET criteria, using the distal internal carotid diameter as the denominator. CONTRAST:  50mL OMNIPAQUE IOHEXOL 350 MG/ML SOLN COMPARISON:  None. FINDINGS: CT HEAD There is no acute intracranial hemorrhage or infarct. No mass lesion or midline shift. Gray-white matter differentiation is well maintained. Ventricles are normal in size without evidence of hydrocephalus. CSF containing spaces are within normal limits. No extra-axial fluid collection. The calvarium is intact. Orbital soft tissues are within  normal limits. The paranasal sinuses and mastoid air cells are well pneumatized and free of fluid. Scalp soft tissues are unremarkable. CTA NECK Aortic arch: Visualized aortic arch is of normal caliber with normal 3 vessel morphology. Minimal atheromatous plaque present within the arch itself. No high-grade stenosis at the origin of the great vessels. Visualized subclavian arteries widely patent. Right carotid system: Right common carotid artery widely patent from its origin to the bifurcation. Right ICA widely patent from the bifurcation to the skullbase. No evidence for stenosis, dissection, or vascular occlusion within the right carotid artery system. Left carotid system: Left common carotid artery widely patent from its origin to the bifurcation. Mild narrowing of the proximal left ICA at its origin at the bifurcation with associated stenosis of approximately 30-40% by NASCET criteria. Left ICA otherwise widely patent to the skullbase. No evidence for dissection, significant stenosis, or vascular occlusion within the left carotid artery system. Vertebral arteries:Both vertebral arteries arise from the subclavian arteries. Minimal plaque within the pre foraminal right V1 segment without significant stenosis. Vertebral arteries widely patent to the skullbase without evidence for stenosis, dissection, or occlusion. Skeleton: No acute osseous abnormality. No worrisome lytic or  blastic osseous lesions. Moderate degenerative spondylolysis at C6-7. Other neck: Visualized lungs are demonstrate no acute process. 3 mm nodule at the left lung apex (series 501, image 10). Visualized superior mediastinum within normal limits. Thyroid gland normal. No acute soft tissue abnormality within the neck. No adenopathy. CTA HEAD Anterior circulation: The petrous, cavernous, and supraclinoid segments of the ICAs are widely patent. Minimal plaque within the carotid siphons bilaterally. A1 segments, anterior communicating artery, and  anterior cerebral arteries well opacified. M1 segments widely patent without stenosis or occlusion. MCA bifurcations normal. Distal MCA branches well opacified and symmetric. Posterior circulation: Vertebral arteries patent to the vertebrobasilar junction. Left vertebral artery slightly dominant. Posterior inferior cerebellar arteries patent bilaterally. Basilar artery mildly tortuous but widely patent. Superior cerebellar and posterior cerebral arteries well opacified and normal in appearance. Venous sinuses: Patent without evidence for venous sinus thrombosis. Anatomic variants: No anatomic variant.  No aneurysm. Delayed phase: No abnormal enhancement on delayed sequence. IMPRESSION: CTA NECK IMPRESSION: 1. No acute abnormality within the major arterial vasculature of the neck. No evidence for dissection. 2. 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 3. Otherwise normal CTA of the neck. No other focal stenosis identified. 4. 3 mm left upper lobe pulmonary nodule, indeterminate. If the patient is at high risk for bronchogenic carcinoma, follow-up chest CT at 1 year is recommended. If the patient is at low risk, no follow-up is needed. This recommendation follows the consensus statement: Guidelines for Management of Small Pulmonary Nodules Detected on CT Scans: A Statement from the Fleischner Society as published in Radiology 2005; 237:395-400. CTA HEAD IMPRESSION: 1. No acute intracranial process. 2. Normal CTA of the brain. Electronically Signed   By: Rise Mu M.D.   On: 06/20/2015 03:11    EKG: Independently reviewed.  Assessment/Plan Active Problems:   Chest pain   1. Chest pain - 1. Not given ASA yet, have ordered ASA 325 to be given 2. NTG ointment, which has resolved CP but caused a severe headache 3. Serial trops 4. Chest pain obs pathway 5. Zofran for NTG induced headache as percocet hasnt really helped 6. Likely will need provocative  testing given HEART score of 6. 2. HTN - continue home meds when we know dose of HCTZ 3. HLD - patient dosent take the HLD med she was prescribed it seems    Code Status: Full  Family Communication: Family at bedside Disposition Plan: Admit to obs   Time spent: 70 min  GARDNER, JARED M. [facility] Pager [phone]  If 7AM-7PM, please contact the day team taking care of the patient [email or web address] Password [email or web address] 06/20/2015, 4:50 AM

## 2015-06-20 NOTE — Consult Note (Signed)
CARDIOLOGY CONSULT NOTE   Patient ID: Brenda Terry MRN: [id number], DOB/AGE: 63/06/1950   Admit date: 06/19/2015 Date of Consult: 06/20/2015 Reason for Consult: Chest Pain Physician Requesting Consult: Dr. Gwenlyn PerkingMadera   Primary Physician: No PCP Per Patient Primary Cardiologist: New  HPI: Brenda HangMable Tench is a 65 y.o. female with past medical history of HTN, HLD, Factor V Leiden, Fibromyalgia, and significant family history of CAD who presented to [facility] on [facility]/2015 for evaluation of chest pressure.  The patient reports having intermittent chest pressure for the past two months which radiates to the shoulder and neck. The pressure comes on with activity and is now more frequent, even occurring when climbing the stairs in her home. She reports associated dyspnea with the pressure.  Last night, she developed chest pressure and had 10/10 pain, radiating to her neck and down her left arm. She was diaphoretic at the time. She reports the pain was so severe she lost consciousness while talking on the phone with her daughter. She denies any presyncopal lightheadedness, dizziness, or vision changes. EMS was called and [facility] was [facility]. She was given SL NTG en route as well as on a NTG patch in the ED and reports having relief with her symptoms. The NTG patch did cause her to have a severe headache.  While admitted, her initial troponin values have been negative. Creatinine stable at 0.73. Platelets at 160. EKG shows NSR, HR 78, no acute ST or T-wave changes. CXR showed no acute findings. CTA showed no evidence of dissection. A 3mm left upper lobe pulmonary nodule was noted.   She reports having a NST and cardiac catheterization 7-10 years ago which she believes was "normal". Did not require PCI or stent placement at that time.  As for her Factor V Leiden, she was on Coumadin in the past but was instructed to stop taking it by her hematologist. She denies any history of DVT's or  PE.  She also reports a strong history of CAD. Her father passed away from a MI at age 63 and her maternal grandmother passed away in her 7960's from an MI. Her brother is currently living but had a MI in his 5540's. She reports her father had a stress test and normal EKG around the time of his MI and she prefers not to have a NST.   Problem List Past Medical History  Diagnosis Date  . Restless leg syndrome   . Factor V Leiden (HCC)   . Hypertension   . HLD (hyperlipidemia)   . Fibromyalgia     Past Surgical History  Procedure Laterality Date  . Cesarean section    . Appendectomy    . Cholecystectomy    . Abdominal surgery    . Abdominal hysterectomy       Allergies Allergies  Allergen Reactions  . Depakote [Divalproex Sodium]     Makes pt have seizures  . Cyclobenzaprine Itching      Inpatient Medications . enoxaparin (LOVENOX) injection  40 mg Subcutaneous Q24H  . gabapentin  200 mg Oral QHS  . [START ON 06/21/2015] Influenza vac split quadrivalent PF  0.5 mL Intramuscular Tomorrow-1000    Family History Family History  Problem Relation Age of Onset  . Heart attack Father [id number]    Deceased  . Heart attack Brother 40  . Heart attack Maternal Grandmother 360    Deceased     Social History Social History   Social History  . Marital Status:  Married    Spouse Name: N/A  . Number of Children: N/A  . Years of Education: N/A   Occupational History  . Not on file.   Social History Main Topics  . Smoking status: Never Smoker   . Smokeless tobacco: Not on file  . Alcohol Use: No  . Drug Use: No  . Sexual Activity: Not on file   Other Topics Concern  . Not on file   Social History Narrative     Review of Systems General:  No chills, fever, night sweats or weight changes.  Cardiovascular:  No edema, orthopnea, palpitations, paroxysmal nocturnal dyspnea. Positive for chest pain and dyspnea on exertion. Dermatological: No rash, lesions/masses Respiratory: No  cough, Positive for dyspnea Urologic: No hematuria, dysuria Abdominal:   No diarrhea, bright red blood per rectum, melena, or hematemesis. Positive for nausea and vomiting. Neurologic:  No visual changes, wkns, changes in mental status. All other systems reviewed and are otherwise negative except as noted above.  Physical Exam  Blood pressure 115/75, pulse 65, temperature 97.4 F (36.3 C), temperature source Oral, resp. rate 18, SpO2 98 %.  General: Pleasant, Caucasian female appearing in NAD Psych: Normal affect. Neuro: Alert and oriented X 3. Moves all extremities spontaneously. HEENT: Normal  Neck: Supple without bruits or JVD. Lungs:  Resp regular and unlabored, CTA without wheezing or rales. Heart: RRR no s3, s4, or murmurs. Abdomen: Soft, non-tender, non-distended, BS + x 4.  Extremities: No clubbing, cyanosis or edema. DP/PT/Radials 2+ and equal bilaterally.  Labs  Recent Labs  06/20/15 0520 06/20/15 0759  TROPONINI <0.03 <0.03   Lab Results  Component Value Date   WBC 5.8 06/19/2015   HGB 13.0 06/19/2015   HCT 37.4 06/19/2015   MCV 89.9 06/19/2015   PLT 160 06/19/2015     Recent Labs Lab 06/19/15 2329  NA 141  K 4.2  CL 108  CO2 23  BUN 14  CREATININE 0.73  CALCIUM 8.9  GLUCOSE 121*    Radiology/Studies  Dg Chest 2 View: 06/20/2015  CLINICAL DATA:  65 year old female with chest pain and syncope EXAM: CHEST  2 VIEW COMPARISON:  Radiograph dated 10/15/2014 FINDINGS: Two views of the chest demonstrate linear left lung base atelectasis/ scarring. No focal consolidation, pleural effusion, or pneumothorax. The cardiac silhouette is within normal limits. The osseous structures appear unremarkable. IMPRESSION: No active cardiopulmonary disease. Electronically Signed   By: Elgie Collard M.D.   On: 06/20/2015 01:25   Ct Angio Neck W/cm &/or Wo/cm: 06/20/2015  CLINICAL DATA:  Initial evaluation for acute syncopal episode, severe left neck and back pain. EXAM: CT  ANGIOGRAPHY HEAD AND NECK TECHNIQUE: Multidetector CT imaging of the head and neck was performed using the standard protocol during bolus administration of intravenous contrast. Multiplanar CT image reconstructions and MIPs were obtained to evaluate the vascular anatomy. Carotid stenosis measurements (when applicable) are obtained utilizing NASCET criteria, using the distal internal carotid diameter as the denominator. CONTRAST:  50mL OMNIPAQUE IOHEXOL 350 MG/ML SOLN COMPARISON:  None. FINDINGS: CT HEAD There is no acute intracranial hemorrhage or infarct. No mass lesion or midline shift. Gray-white matter differentiation is well maintained. Ventricles are normal in size without evidence of hydrocephalus. CSF containing spaces are within normal limits. No extra-axial fluid collection. The calvarium is intact. Orbital soft tissues are within normal limits. The paranasal sinuses and mastoid air cells are well pneumatized and free of fluid. Scalp soft tissues are unremarkable. CTA NECK Aortic arch: Visualized aortic arch is  of normal caliber with normal 3 vessel morphology. Minimal atheromatous plaque present within the arch itself. No high-grade stenosis at the origin of the great vessels. Visualized subclavian arteries widely patent. Right carotid system: Right common carotid artery widely patent from its origin to the bifurcation. Right ICA widely patent from the bifurcation to the skullbase. No evidence for stenosis, dissection, or vascular occlusion within the right carotid artery system. Left carotid system: Left common carotid artery widely patent from its origin to the bifurcation. Mild narrowing of the proximal left ICA at its origin at the bifurcation with associated stenosis of approximately 30-40% by NASCET criteria. Left ICA otherwise widely patent to the skullbase. No evidence for dissection, significant stenosis, or vascular occlusion within the left carotid artery system. Vertebral arteries:Both  vertebral arteries arise from the subclavian arteries. Minimal plaque within the pre foraminal right V1 segment without significant stenosis. Vertebral arteries widely patent to the skullbase without evidence for stenosis, dissection, or occlusion. Skeleton: No acute osseous abnormality. No worrisome lytic or blastic osseous lesions. Moderate degenerative spondylolysis at C6-7. Other neck: Visualized lungs are demonstrate no acute process. 3 mm nodule at the left lung apex (series 501, image 10). Visualized superior mediastinum within normal limits. Thyroid gland normal. No acute soft tissue abnormality within the neck. No adenopathy. CTA HEAD Anterior circulation: The petrous, cavernous, and supraclinoid segments of the ICAs are widely patent. Minimal plaque within the carotid siphons bilaterally. A1 segments, anterior communicating artery, and anterior cerebral arteries well opacified. M1 segments widely patent without stenosis or occlusion. MCA bifurcations normal. Distal MCA branches well opacified and symmetric. Posterior circulation: Vertebral arteries patent to the vertebrobasilar junction. Left vertebral artery slightly dominant. Posterior inferior cerebellar arteries patent bilaterally. Basilar artery mildly tortuous but widely patent. Superior cerebellar and posterior cerebral arteries well opacified and normal in appearance. Venous sinuses: Patent without evidence for venous sinus thrombosis. Anatomic variants: No anatomic variant.  No aneurysm. Delayed phase: No abnormal enhancement on delayed sequence. IMPRESSION: CTA NECK IMPRESSION: 1. No acute abnormality within the major arterial vasculature of the neck. No evidence for dissection. 2. 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 3. Otherwise normal CTA of the neck. No other focal stenosis identified. 4. 3 mm left upper lobe pulmonary nodule, indeterminate. If the patient is at high risk for bronchogenic  carcinoma, follow-up chest CT at 1 year is recommended. If the patient is at low risk, no follow-up is needed. This recommendation follows the consensus statement: Guidelines for Management of Small Pulmonary Nodules Detected on CT Scans: A Statement from the Fleischner Society as published in Radiology 2005; 237:395-400. CTA HEAD IMPRESSION: 1. No acute intracranial process. 2. Normal CTA of the brain. Electronically Signed   By: Rise Mu M.D.   On: 06/20/2015 03:11    ECG: NSR, HR 78, no acute ST or T-wave changes.   ECHOCARDIOGRAM: None on file   ASSESSMENT AND PLAN  1. Chest Pain with Moderate Risk of Cardiac Etiology - presents with worsening chest pressure over the past two months, accompanied by dyspnea and worse with physical exertion. Developed severe pain last night which was accompanied by diaphoresis. Relieved with SL NTG. - Strong family history of CAD with father having a MI at age 13, MGM in 55's, and brother in his 25's. - initial troponin values negative. EKG without acute ischemic changes. Cyclic values pending. - check Lipid Panel and A1c for risk stratification. - will plan for LHC today with  possible PCI. She has been NPO since yesterday. Pending results of cath, will need to consider initiating low-dose BB and statin.  2. HTN - BP has been 101/62 - 158/95 in the past 24 hours. - was prescribed HCTZ in the past but she has not been taking it regularly. - continue to monitor. Consider addition of low-dose ACE-I if HTN persists.   3. HLD - reports not taking her statin for a long time - will check Lipid Panel for risk stratification.  4. Factor V Leiden Deficiency - denies any history of PE or DVT. Currently not on any anticoagulants. - continue to monitor.  5. Pulmonary Nodule - 3mm left upper lobe pulmonary nodule was noted on CT this admission. - will need to be followed as outpatient.    Signed, Ellsworth Lennox, PA-C 06/20/2015, 2:17  PM Pager: [phone] Agree with assessment and plan.  She has multiple risk factors and her history of chest pain is very suggestive of coronary ischemia.  Her family history is very notable for early coronary death.  Her last cardiac catheterization was 8-10 years ago and at that time she apparently had no significant disease.  She gives a history of factor V Leiden deficiency which is also prevalent in multiple family members.  However, interestingly, normally is on long-term Coumadin including the patient.  The patient denies any prior history of DVT or pulmonary emboli.  Her chest x-ray and EKG personally reviewed and are unremarkable.  She is a nonsmoker. With her clinical presentation and strong family history we will proceed with left heart cardiac catheterization today.The risks and benefits of a cardiac catheterization including, but not limited to, death, stroke, MI, kidney damage and bleeding were discussed with the patient who indicates understanding and agrees to proceed.

## 2015-06-21 DIAGNOSIS — E785 Hyperlipidemia, unspecified: Secondary | ICD-10-CM | POA: Diagnosis not present

## 2015-06-21 DIAGNOSIS — I10 Essential (primary) hypertension: Secondary | ICD-10-CM

## 2015-06-21 DIAGNOSIS — G2581 Restless legs syndrome: Secondary | ICD-10-CM | POA: Insufficient documentation

## 2015-06-21 DIAGNOSIS — R072 Precordial pain: Secondary | ICD-10-CM | POA: Diagnosis not present

## 2015-06-21 DIAGNOSIS — G609 Hereditary and idiopathic neuropathy, unspecified: Secondary | ICD-10-CM | POA: Insufficient documentation

## 2015-06-21 DIAGNOSIS — K219 Gastro-esophageal reflux disease without esophagitis: Secondary | ICD-10-CM | POA: Insufficient documentation

## 2015-06-21 DIAGNOSIS — R079 Chest pain, unspecified: Secondary | ICD-10-CM | POA: Diagnosis not present

## 2015-06-21 LAB — LIPID PANEL
CHOL/HDL RATIO: 6.1 ratio
Cholesterol: 194 mg/dL (ref 0–200)
HDL: 32 mg/dL — AB (ref >40)
LDL CALC: 114 mg/dL — AB (ref 0–99)
TRIGLYCERIDES: 241 mg/dL — AB (ref <150)
VLDL: 48 mg/dL — ABNORMAL HIGH (ref 0–40)

## 2015-06-21 MED ORDER — ALBUTEROL SULFATE HFA 108 (90 BASE) MCG/ACT IN AERS: 1.0000 | INHALATION_SPRAY | Freq: Four times a day (QID) | RESPIRATORY_TRACT | Status: AC | PRN

## 2015-06-21 MED ORDER — LANSOPRAZOLE 30 MG PO CPDR: 30.0000 mg | DELAYED_RELEASE_CAPSULE | Freq: Every day | ORAL | Status: DC

## 2015-06-21 MED ORDER — ROPINIROLE HCL 2 MG PO TABS: 2.0000 mg | ORAL_TABLET | Freq: Every day | ORAL | Status: DC

## 2015-06-21 MED ORDER — SIMVASTATIN 40 MG PO TABS: 40.0000 mg | ORAL_TABLET | Freq: Every day | ORAL | Status: DC

## 2015-06-21 NOTE — Discharge Summary (Signed)
Physician Discharge Summary  Brenda Terry ZOX:[id number] DOB: 02/26/1951 DOA: 06/19/2015  PCP: No PCP Per Patient  Admit date: 06/19/2015 Discharge date: 06/21/2015  Time spent: 35 minutes  Recommendations for Outpatient Follow-up:  1. Repeat BMET to follow electrolytes and renal function 2. Reassess BP and adjust medications as needed 3. Needs outpatient visit with neurology for further evaluation and treatment of neuropathy    Discharge Diagnoses:  Chest pain (non cardiac) Benign essential HTN HLD (hyperlipidemia) GERD Chronic pain syndrome/Fibromyalgia RLS Neuropathy   Discharge Condition: stable and improved. At discharge no CP, no SOB. Will follow up with PCP as instructed and also with neurology in outpatient setting   Diet recommendation: heart healthy diet   Filed Weights   06/20/15 1408  Weight: 77.1 kg (169 lb 15.6 oz)    History of present illness:  65 y.o. female with h/o HTN, HLD, patient presents to the ED with c/o intermittent episodes of gradually worsening, pressure-like chest pain with associated nausea and headache. Symptoms have been getting worse for the past 2 months. Symptoms typically occur with activity, walking up steps makes pain worse. Pain is left sided with radiation to L shoulder. Today she had sudden LOC at home, and she woke up in the ambulance with severe L sided chest pain.   Hospital Course:  1-chest pain: heart score 5 and some typical features -neg troponin  -no EKG changes for ischemia and sinus rhythm on telemetry  -left heart cath with normal wall motion and EF; no significant CAD -continue risk factors modifications  2-HTN: stable and well controlled -will continue home antihypertensive regimen and has been advise to follow heart healthy diet  3-GERD: will use PPI twice a day  4-HLD: continue statins  5-neuropathy: normal B12 and TSH -will continue use of neurontin -follow up with neurology as an outpatient   6-chronic  pain syndrome/fibromyalgia  -will continue home analgesics regimen   7-RLS: will continue Requip   Procedures:  Left heart cath: 06/20/15 -The left ventricular size is normal. The left ventricular systolic function is normal. The left ventricular ejection fraction is 55-65% by visual estimate.  -no significant CAD   Consultations:  Cardiology   Discharge Exam: Filed Vitals:   06/20/15 2141 06/21/15 0331  BP: 113/54 106/59  Pulse: 64 64  Temp: 98.2 F (36.8 C) 97.9 F (36.6 C)  Resp: 18 18    General: afebrile, no CP, no SOB. No nausea, no vomiting. Patient endorses tingling sensation and numbness in her scalp (left side; extending all the way back to her temporal-occipital region). No blurred vision. Cardiovascular: S1 and S2, no rubs, no gallops, no murmurs  Respiratory: CTA bilaterally Abd: soft, NT, ND, positive BS Extremities: no edema, no cyanosis  Neuro: no focal motor or neurologic deficit appreciated   Discharge Instructions   Discharge Instructions    Diet - low sodium heart healthy    Complete by:  As directed      Discharge instructions    Complete by:  As directed   Follow heart healthy diet Please arrange follow up with neurology as advise Take medications as prescribed  Keep yourself well hydrated Arrange follow up          Current Discharge Medication List    START taking these medications   Details  albuterol (PROVENTIL HFA;VENTOLIN HFA) 108 (90 Base) MCG/ACT inhaler Inhale 1 puff into the lungs every 6 (six) hours as needed for wheezing or shortness of breath. Qty: 1 Inhaler, Refills: 1  lansoprazole (PREVACID) 30 MG capsule Take 1 capsule (30 mg total) by mouth daily at 12 noon. Qty: 60 capsule, Refills: 1    rOPINIRole (REQUIP) 2 MG tablet Take 1 tablet (2 mg total) by mouth at bedtime. approx 1-2 hours before bedtime Qty: 30 tablet, Refills: 0    simvastatin (ZOCOR) 40 MG tablet Take 1 tablet (40 mg total) by mouth daily. Qty: 30  tablet, Refills: 1      CONTINUE these medications which have NOT CHANGED   Details  gabapentin (NEURONTIN) 100 MG capsule Take 200 mg by mouth at bedtime.    HYDROcodone-acetaminophen (NORCO) 10-325 MG tablet Take 1 tablet by mouth every 4 (four) hours as needed for moderate pain.    hydrochlorothiazide (HYDRODIURIL) 25 MG tablet Take 25 mg by mouth daily.       Allergies  Allergen Reactions  . Depakote [Divalproex Sodium]     Makes pt have seizures  . Cyclobenzaprine Itching   Follow-up Information    Follow up with [facility] On 07/21/2015.   Why:  07/20/14 at 11:15   Contact information:   [address] [place] [address]       Follow up with Lesly Dukes, MD.   Specialty:  Neurology   Why:  call office to arrange appointment    Contact information:   [address] [place] [address] [phone]       The results of significant diagnostics from this hospitalization (including imaging, microbiology, ancillary and laboratory) are listed below for reference.    Significant Diagnostic Studies: Ct Angio Head W/cm &/or Wo Cm  06/20/2015  CLINICAL DATA:  Initial evaluation for acute syncopal episode, severe left neck and back pain. EXAM: CT ANGIOGRAPHY HEAD AND NECK TECHNIQUE: Multidetector CT imaging of the head and neck was performed using the standard protocol during bolus administration of intravenous contrast. Multiplanar CT image reconstructions and MIPs were obtained to evaluate the vascular anatomy. Carotid stenosis measurements (when applicable) are obtained utilizing NASCET criteria, using the distal internal carotid diameter as the denominator. CONTRAST:  50mL OMNIPAQUE IOHEXOL 350 MG/ML SOLN COMPARISON:  None. FINDINGS: CT HEAD There is no acute intracranial hemorrhage or infarct. No mass lesion or midline shift. Gray-white matter differentiation is well maintained. Ventricles are normal in size without  evidence of hydrocephalus. CSF containing spaces are within normal limits. No extra-axial fluid collection. The calvarium is intact. Orbital soft tissues are within normal limits. The paranasal sinuses and mastoid air cells are well pneumatized and free of fluid. Scalp soft tissues are unremarkable. CTA NECK Aortic arch: Visualized aortic arch is of normal caliber with normal 3 vessel morphology. Minimal atheromatous plaque present within the arch itself. No high-grade stenosis at the origin of the great vessels. Visualized subclavian arteries widely patent. Right carotid system: Right common carotid artery widely patent from its origin to the bifurcation. Right ICA widely patent from the bifurcation to the skullbase. No evidence for stenosis, dissection, or vascular occlusion within the right carotid artery system. Left carotid system: Left common carotid artery widely patent from its origin to the bifurcation. Mild narrowing of the proximal left ICA at its origin at the bifurcation with associated stenosis of approximately 30-40% by NASCET criteria. Left ICA otherwise widely patent to the skullbase. No evidence for dissection, significant stenosis, or vascular occlusion within the left carotid artery system. Vertebral arteries:Both vertebral arteries arise from the subclavian arteries. Minimal plaque within the pre foraminal right V1 segment  without significant stenosis. Vertebral arteries widely patent to the skullbase without evidence for stenosis, dissection, or occlusion. Skeleton: No acute osseous abnormality. No worrisome lytic or blastic osseous lesions. Moderate degenerative spondylolysis at C6-7. Other neck: Visualized lungs are demonstrate no acute process. 3 mm nodule at the left lung apex (series 501, image 10). Visualized superior mediastinum within normal limits. Thyroid gland normal. No acute soft tissue abnormality within the neck. No adenopathy. CTA HEAD Anterior circulation: The petrous,  cavernous, and supraclinoid segments of the ICAs are widely patent. Minimal plaque within the carotid siphons bilaterally. A1 segments, anterior communicating artery, and anterior cerebral arteries well opacified. M1 segments widely patent without stenosis or occlusion. MCA bifurcations normal. Distal MCA branches well opacified and symmetric. Posterior circulation: Vertebral arteries patent to the vertebrobasilar junction. Left vertebral artery slightly dominant. Posterior inferior cerebellar arteries patent bilaterally. Basilar artery mildly tortuous but widely patent. Superior cerebellar and posterior cerebral arteries well opacified and normal in appearance. Venous sinuses: Patent without evidence for venous sinus thrombosis. Anatomic variants: No anatomic variant.  No aneurysm. Delayed phase: No abnormal enhancement on delayed sequence. IMPRESSION: CTA NECK IMPRESSION: 1. No acute abnormality within the major arterial vasculature of the neck. No evidence for dissection. 2. 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 3. Otherwise normal CTA of the neck. No other focal stenosis identified. 4. 3 mm left upper lobe pulmonary nodule, indeterminate. If the patient is at high risk for bronchogenic carcinoma, follow-up chest CT at 1 year is recommended. If the patient is at low risk, no follow-up is needed. This recommendation follows the consensus statement: Guidelines for Management of Small Pulmonary Nodules Detected on CT Scans: A Statement from the Fleischner Society as published in Radiology 2005; 237:395-400. CTA HEAD IMPRESSION: 1. No acute intracranial process. 2. Normal CTA of the brain. Electronically Signed   By: Rise Mu M.D.   On: 06/20/2015 03:11   Dg Chest 2 View  06/20/2015  CLINICAL DATA:  65 year old female with chest pain and syncope EXAM: CHEST  2 VIEW COMPARISON:  Radiograph dated 10/15/2014 FINDINGS: Two views of the chest demonstrate linear  left lung base atelectasis/ scarring. No focal consolidation, pleural effusion, or pneumothorax. The cardiac silhouette is within normal limits. The osseous structures appear unremarkable. IMPRESSION: No active cardiopulmonary disease. Electronically Signed   By: Elgie Collard M.D.   On: 06/20/2015 01:25   Ct Angio Neck W/cm &/or Wo/cm  06/20/2015  CLINICAL DATA:  Initial evaluation for acute syncopal episode, severe left neck and back pain. EXAM: CT ANGIOGRAPHY HEAD AND NECK TECHNIQUE: Multidetector CT imaging of the head and neck was performed using the standard protocol during bolus administration of intravenous contrast. Multiplanar CT image reconstructions and MIPs were obtained to evaluate the vascular anatomy. Carotid stenosis measurements (when applicable) are obtained utilizing NASCET criteria, using the distal internal carotid diameter as the denominator. CONTRAST:  50mL OMNIPAQUE IOHEXOL 350 MG/ML SOLN COMPARISON:  None. FINDINGS: CT HEAD There is no acute intracranial hemorrhage or infarct. No mass lesion or midline shift. Gray-white matter differentiation is well maintained. Ventricles are normal in size without evidence of hydrocephalus. CSF containing spaces are within normal limits. No extra-axial fluid collection. The calvarium is intact. Orbital soft tissues are within normal limits. The paranasal sinuses and mastoid air cells are well pneumatized and free of fluid. Scalp soft tissues are unremarkable. CTA NECK Aortic arch: Visualized aortic arch is of normal caliber with normal 3 vessel morphology. Minimal  atheromatous plaque present within the arch itself. No high-grade stenosis at the origin of the great vessels. Visualized subclavian arteries widely patent. Right carotid system: Right common carotid artery widely patent from its origin to the bifurcation. Right ICA widely patent from the bifurcation to the skullbase. No evidence for stenosis, dissection, or vascular occlusion within the  right carotid artery system. Left carotid system: Left common carotid artery widely patent from its origin to the bifurcation. Mild narrowing of the proximal left ICA at its origin at the bifurcation with associated stenosis of approximately 30-40% by NASCET criteria. Left ICA otherwise widely patent to the skullbase. No evidence for dissection, significant stenosis, or vascular occlusion within the left carotid artery system. Vertebral arteries:Both vertebral arteries arise from the subclavian arteries. Minimal plaque within the pre foraminal right V1 segment without significant stenosis. Vertebral arteries widely patent to the skullbase without evidence for stenosis, dissection, or occlusion. Skeleton: No acute osseous abnormality. No worrisome lytic or blastic osseous lesions. Moderate degenerative spondylolysis at C6-7. Other neck: Visualized lungs are demonstrate no acute process. 3 mm nodule at the left lung apex (series 501, image 10). Visualized superior mediastinum within normal limits. Thyroid gland normal. No acute soft tissue abnormality within the neck. No adenopathy. CTA HEAD Anterior circulation: The petrous, cavernous, and supraclinoid segments of the ICAs are widely patent. Minimal plaque within the carotid siphons bilaterally. A1 segments, anterior communicating artery, and anterior cerebral arteries well opacified. M1 segments widely patent without stenosis or occlusion. MCA bifurcations normal. Distal MCA branches well opacified and symmetric. Posterior circulation: Vertebral arteries patent to the vertebrobasilar junction. Left vertebral artery slightly dominant. Posterior inferior cerebellar arteries patent bilaterally. Basilar artery mildly tortuous but widely patent. Superior cerebellar and posterior cerebral arteries well opacified and normal in appearance. Venous sinuses: Patent without evidence for venous sinus thrombosis. Anatomic variants: No anatomic variant.  No aneurysm. Delayed  phase: No abnormal enhancement on delayed sequence. IMPRESSION: CTA NECK IMPRESSION: 1. No acute abnormality within the major arterial vasculature of the neck. No evidence for dissection. 2. 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 3. Otherwise normal CTA of the neck. No other focal stenosis identified. 4. 3 mm left upper lobe pulmonary nodule, indeterminate. If the patient is at high risk for bronchogenic carcinoma, follow-up chest CT at 1 year is recommended. If the patient is at low risk, no follow-up is needed. This recommendation follows the consensus statement: Guidelines for Management of Small Pulmonary Nodules Detected on CT Scans: A Statement from the Fleischner Society as published in Radiology 2005; 237:395-400. CTA HEAD IMPRESSION: 1. No acute intracranial process. 2. Normal CTA of the brain. Electronically Signed   By: Rise Mu M.D.   On: 06/20/2015 03:11    Microbiology: No results found for this or any previous visit (from the past 240 hour(s)).   Labs: Basic Metabolic Panel:  Recent Labs Lab 06/19/15 2329 06/20/15 1924  NA 141  --   K 4.2  --   CL 108  --   CO2 23  --   GLUCOSE 121*  --   BUN 14  --   CREATININE 0.73 0.68  CALCIUM 8.9  --    CBC:  Recent Labs Lab 06/19/15 2329 06/20/15 1924  WBC 5.8 6.3  HGB 13.0 13.4  HCT 37.4 37.7  MCV 89.9 89.5  PLT 160 121*   Cardiac Enzymes:  Recent Labs Lab 06/20/15 0520 06/20/15 0759 06/20/15 1357  TROPONINI <0.03 <0.03 <  0.03    Signed:  Vassie Loll MD.  [facility] 06/21/2015, 10:52 AM

## 2015-06-21 NOTE — Progress Notes (Signed)
TELEMETRY: Reviewed telemetry pt in NSR: Filed Vitals:   06/20/15 1848 06/20/15 2129 06/20/15 2141 06/21/15 0331  BP:   113/54 106/59  Pulse: 74 62 64 64  Temp:   98.2 F (36.8 C) 97.9 F (36.6 C)  TempSrc:   Oral Oral  Resp:   18 18  Height:      Weight:      SpO2: 98% 97% 97% 96%    Intake/Output Summary (Last 24 hours) at 06/21/15 0939 Last data filed at 06/20/15 2000  Gross per 24 hour  Intake    360 ml  Output      0 ml  Net    360 ml   Filed Weights   06/20/15 1408  Weight: 77.1 kg (169 lb 15.6 oz)    Subjective No complaints today.  . enoxaparin (LOVENOX) injection  40 mg Subcutaneous Q24H  . gabapentin  200 mg Oral QHS  . Influenza vac split quadrivalent PF  0.5 mL Intramuscular Tomorrow-1000  . sodium chloride  3 mL Intravenous Q12H      LABS: Basic Metabolic Panel:  Recent Labs  82/95/62 2329 06/20/15 1924  NA 141  --   K 4.2  --   CL 108  --   CO2 23  --   GLUCOSE 121*  --   BUN 14  --   CREATININE 0.73 0.68  CALCIUM 8.9  --    Liver Function Tests: No results for input(s): AST, ALT, ALKPHOS, BILITOT, PROT, ALBUMIN in the last 72 hours. No results for input(s): LIPASE, AMYLASE in the last 72 hours. CBC:  Recent Labs  06/19/15 2329 06/20/15 1924  WBC 5.8 6.3  HGB 13.0 13.4  HCT 37.4 37.7  MCV 89.9 89.5  PLT 160 121*   Cardiac Enzymes:  Recent Labs  06/20/15 0520 06/20/15 0759 06/20/15 1357  TROPONINI <0.03 <0.03 <0.03   BNP: No results for input(s): PROBNP in the last 72 hours. D-Dimer:  Recent Labs  06/20/15 1924  DDIMER 0.72*   Hemoglobin A1C: No results for input(s): HGBA1C in the last 72 hours. Fasting Lipid Panel:  Recent Labs  06/21/15 0400  CHOL 194  HDL 32*  LDLCALC 114*  TRIG 241*  CHOLHDL 6.1   Thyroid Function Tests:  Recent Labs  06/20/15 1924  TSH 0.882     Radiology/Studies:  Ct Angio Head W/cm &/or Wo Cm  06/20/2015  CLINICAL DATA:  Initial evaluation for acute syncopal episode,  severe left neck and back pain. EXAM: CT ANGIOGRAPHY HEAD AND NECK TECHNIQUE: Multidetector CT imaging of the head and neck was performed using the standard protocol during bolus administration of intravenous contrast. Multiplanar CT image reconstructions and MIPs were obtained to evaluate the vascular anatomy. Carotid stenosis measurements (when applicable) are obtained utilizing NASCET criteria, using the distal internal carotid diameter as the denominator. CONTRAST:  50mL OMNIPAQUE IOHEXOL 350 MG/ML SOLN COMPARISON:  None. FINDINGS: CT HEAD There is no acute intracranial hemorrhage or infarct. No mass lesion or midline shift. Gray-white matter differentiation is well maintained. Ventricles are normal in size without evidence of hydrocephalus. CSF containing spaces are within normal limits. No extra-axial fluid collection. The calvarium is intact. Orbital soft tissues are within normal limits. The paranasal sinuses and mastoid air cells are well pneumatized and free of fluid. Scalp soft tissues are unremarkable. CTA NECK Aortic arch: Visualized aortic arch is of normal caliber with normal 3 vessel morphology. Minimal atheromatous plaque present within the arch itself. No high-grade stenosis at  the origin of the great vessels. Visualized subclavian arteries widely patent. Right carotid system: Right common carotid artery widely patent from its origin to the bifurcation. Right ICA widely patent from the bifurcation to the skullbase. No evidence for stenosis, dissection, or vascular occlusion within the right carotid artery system. Left carotid system: Left common carotid artery widely patent from its origin to the bifurcation. Mild narrowing of the proximal left ICA at its origin at the bifurcation with associated stenosis of approximately 30-40% by NASCET criteria. Left ICA otherwise widely patent to the skullbase. No evidence for dissection, significant stenosis, or vascular occlusion within the left carotid  artery system. Vertebral arteries:Both vertebral arteries arise from the subclavian arteries. Minimal plaque within the pre foraminal right V1 segment without significant stenosis. Vertebral arteries widely patent to the skullbase without evidence for stenosis, dissection, or occlusion. Skeleton: No acute osseous abnormality. No worrisome lytic or blastic osseous lesions. Moderate degenerative spondylolysis at C6-7. Other neck: Visualized lungs are demonstrate no acute process. 3 mm nodule at the left lung apex (series 501, image 10). Visualized superior mediastinum within normal limits. Thyroid gland normal. No acute soft tissue abnormality within the neck. No adenopathy. CTA HEAD Anterior circulation: The petrous, cavernous, and supraclinoid segments of the ICAs are widely patent. Minimal plaque within the carotid siphons bilaterally. A1 segments, anterior communicating artery, and anterior cerebral arteries well opacified. M1 segments widely patent without stenosis or occlusion. MCA bifurcations normal. Distal MCA branches well opacified and symmetric. Posterior circulation: Vertebral arteries patent to the vertebrobasilar junction. Left vertebral artery slightly dominant. Posterior inferior cerebellar arteries patent bilaterally. Basilar artery mildly tortuous but widely patent. Superior cerebellar and posterior cerebral arteries well opacified and normal in appearance. Venous sinuses: Patent without evidence for venous sinus thrombosis. Anatomic variants: No anatomic variant.  No aneurysm. Delayed phase: No abnormal enhancement on delayed sequence. IMPRESSION: CTA NECK IMPRESSION: 1. No acute abnormality within the major arterial vasculature of the neck. No evidence for dissection. 2. 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 3. Otherwise normal CTA of the neck. No other focal stenosis identified. 4. 3 mm left upper lobe pulmonary nodule, indeterminate. If the patient  is at high risk for bronchogenic carcinoma, follow-up chest CT at 1 year is recommended. If the patient is at low risk, no follow-up is needed. This recommendation follows the consensus statement: Guidelines for Management of Small Pulmonary Nodules Detected on CT Scans: A Statement from the Fleischner Society as published in Radiology 2005; 237:395-400. CTA HEAD IMPRESSION: 1. No acute intracranial process. 2. Normal CTA of the brain. Electronically Signed   By: Rise Mu M.D.   On: 06/20/2015 03:11   Dg Chest 2 View  06/20/2015  CLINICAL DATA:  65 year old female with chest pain and syncope EXAM: CHEST  2 VIEW COMPARISON:  Radiograph dated 10/15/2014 FINDINGS: Two views of the chest demonstrate linear left lung base atelectasis/ scarring. No focal consolidation, pleural effusion, or pneumothorax. The cardiac silhouette is within normal limits. The osseous structures appear unremarkable. IMPRESSION: No active cardiopulmonary disease. Electronically Signed   By: Elgie Collard M.D.   On: 06/20/2015 01:25   Ct Angio Neck W/cm &/or Wo/cm  06/20/2015  CLINICAL DATA:  Initial evaluation for acute syncopal episode, severe left neck and back pain. EXAM: CT ANGIOGRAPHY HEAD AND NECK TECHNIQUE: Multidetector CT imaging of the head and neck was performed using the standard protocol during bolus administration of intravenous contrast. Multiplanar CT image reconstructions and  MIPs were obtained to evaluate the vascular anatomy. Carotid stenosis measurements (when applicable) are obtained utilizing NASCET criteria, using the distal internal carotid diameter as the denominator. CONTRAST:  50mL OMNIPAQUE IOHEXOL 350 MG/ML SOLN COMPARISON:  None. FINDINGS: CT HEAD There is no acute intracranial hemorrhage or infarct. No mass lesion or midline shift. Gray-white matter differentiation is well maintained. Ventricles are normal in size without evidence of hydrocephalus. CSF containing spaces are within normal  limits. No extra-axial fluid collection. The calvarium is intact. Orbital soft tissues are within normal limits. The paranasal sinuses and mastoid air cells are well pneumatized and free of fluid. Scalp soft tissues are unremarkable. CTA NECK Aortic arch: Visualized aortic arch is of normal caliber with normal 3 vessel morphology. Minimal atheromatous plaque present within the arch itself. No high-grade stenosis at the origin of the great vessels. Visualized subclavian arteries widely patent. Right carotid system: Right common carotid artery widely patent from its origin to the bifurcation. Right ICA widely patent from the bifurcation to the skullbase. No evidence for stenosis, dissection, or vascular occlusion within the right carotid artery system. Left carotid system: Left common carotid artery widely patent from its origin to the bifurcation. Mild narrowing of the proximal left ICA at its origin at the bifurcation with associated stenosis of approximately 30-40% by NASCET criteria. Left ICA otherwise widely patent to the skullbase. No evidence for dissection, significant stenosis, or vascular occlusion within the left carotid artery system. Vertebral arteries:Both vertebral arteries arise from the subclavian arteries. Minimal plaque within the pre foraminal right V1 segment without significant stenosis. Vertebral arteries widely patent to the skullbase without evidence for stenosis, dissection, or occlusion. Skeleton: No acute osseous abnormality. No worrisome lytic or blastic osseous lesions. Moderate degenerative spondylolysis at C6-7. Other neck: Visualized lungs are demonstrate no acute process. 3 mm nodule at the left lung apex (series 501, image 10). Visualized superior mediastinum within normal limits. Thyroid gland normal. No acute soft tissue abnormality within the neck. No adenopathy. CTA HEAD Anterior circulation: The petrous, cavernous, and supraclinoid segments of the ICAs are widely patent. Minimal  plaque within the carotid siphons bilaterally. A1 segments, anterior communicating artery, and anterior cerebral arteries well opacified. M1 segments widely patent without stenosis or occlusion. MCA bifurcations normal. Distal MCA branches well opacified and symmetric. Posterior circulation: Vertebral arteries patent to the vertebrobasilar junction. Left vertebral artery slightly dominant. Posterior inferior cerebellar arteries patent bilaterally. Basilar artery mildly tortuous but widely patent. Superior cerebellar and posterior cerebral arteries well opacified and normal in appearance. Venous sinuses: Patent without evidence for venous sinus thrombosis. Anatomic variants: No anatomic variant.  No aneurysm. Delayed phase: No abnormal enhancement on delayed sequence. IMPRESSION: CTA NECK IMPRESSION: 1. No acute abnormality within the major arterial vasculature of the neck. No evidence for dissection. 2. 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 3. Otherwise normal CTA of the neck. No other focal stenosis identified. 4. 3 mm left upper lobe pulmonary nodule, indeterminate. If the patient is at high risk for bronchogenic carcinoma, follow-up chest CT at 1 year is recommended. If the patient is at low risk, no follow-up is needed. This recommendation follows the consensus statement: Guidelines for Management of Small Pulmonary Nodules Detected on CT Scans: A Statement from the Fleischner Society as published in Radiology 2005; 237:395-400. CTA HEAD IMPRESSION: 1. No acute intracranial process. 2. Normal CTA of the brain. Electronically Signed   By: Rise Mu M.D.   On: 06/20/2015  03:11    PHYSICAL EXAM General: Well developed, well nourished, in no acute distress. Head: Normocephalic, atraumatic, sclera non-icteric, oropharynx is clear Neck: Negative for carotid bruits. JVD not elevated. No adenopathy Lungs: Clear bilaterally to auscultation without wheezes,  rales, or rhonchi. Breathing is unlabored. Heart: RRR S1 S2 without murmurs, rubs, or gallops.  Abdomen: Soft, non-tender, non-distended with normoactive bowel sounds. No hepatomegaly. No rebound/guarding. No obvious abdominal masses. Msk:  Strength and tone appears normal for age. Extremities: No clubbing, cyanosis or edema.  Distal pedal pulses are 2+ and equal bilaterally. Radial cath site without hematoma. Neuro: Alert and oriented X 3. Moves all extremities spontaneously. Psych:  Responds to questions appropriately with a normal affect.  ASSESSMENT AND PLAN: 1. Chest pain. Normal cardiac cath. No complications. Stable for DC today from our standpoint. Follow up with primary care as outpatient to consider other evaluation 2. HTN controlled.  3. Hyperlipidemia. Recommend lifestyle and dietary modification. Repeat labs with these changes. If still elevated consider statin.   Present on Admission:  . Chest pain  Signed, Peter Swazil[place]Jordan, MDFACC 06/21/2015 9:39 AM

## 2015-06-21 NOTE — Clinical Social Work Note (Signed)
CSW consult acknowledged:  Clinical Social Worker received a consult for "problems getting medications and primary care appointment". [facility]   Clinical Social Worker will sign off for now as social work intervention is no longer needed. Please consult us again if new need arises.  Derenda FennelBashira Mars Scheaffer, MSW, LCSWA [phone] 06/21/2015 10:14 AM

## 2015-06-23 ENCOUNTER — Encounter (HOSPITAL_COMMUNITY): Payer: Self-pay | Admitting: Interventional Cardiology

## 2015-06-23 LAB — HEMOGLOBIN A1C
Hgb A1c MFr Bld: 5.1 % (ref 4.8–5.6)
MEAN PLASMA GLUCOSE: 100 mg/dL

## 2015-07-21 ENCOUNTER — Ambulatory Visit: Payer: Medicaid Other | Admitting: Family Medicine

## 2016-04-19 ENCOUNTER — Encounter: Payer: Self-pay | Admitting: Family Medicine

## 2016-04-19 ENCOUNTER — Ambulatory Visit (INDEPENDENT_AMBULATORY_CARE_PROVIDER_SITE_OTHER): Payer: Medicaid Other | Admitting: Family Medicine

## 2016-04-19 VITALS — BP 162/70 | HR 66 | Temp 98.2°F | Resp 16 | Ht 63.0 in | Wt 173.0 lb

## 2016-04-19 DIAGNOSIS — Z131 Encounter for screening for diabetes mellitus: Secondary | ICD-10-CM

## 2016-04-19 DIAGNOSIS — Z114 Encounter for screening for human immunodeficiency virus [HIV]: Secondary | ICD-10-CM

## 2016-04-19 DIAGNOSIS — Z1231 Encounter for screening mammogram for malignant neoplasm of breast: Secondary | ICD-10-CM

## 2016-04-19 DIAGNOSIS — Z1239 Encounter for other screening for malignant neoplasm of breast: Secondary | ICD-10-CM

## 2016-04-19 DIAGNOSIS — Z1211 Encounter for screening for malignant neoplasm of colon: Secondary | ICD-10-CM

## 2016-04-19 DIAGNOSIS — Z1159 Encounter for screening for other viral diseases: Secondary | ICD-10-CM

## 2016-04-19 DIAGNOSIS — I10 Essential (primary) hypertension: Secondary | ICD-10-CM | POA: Diagnosis not present

## 2016-04-19 DIAGNOSIS — R079 Chest pain, unspecified: Secondary | ICD-10-CM

## 2016-04-19 DIAGNOSIS — D66 Hereditary factor VIII deficiency: Secondary | ICD-10-CM

## 2016-04-19 DIAGNOSIS — G609 Hereditary and idiopathic neuropathy, unspecified: Secondary | ICD-10-CM

## 2016-04-19 LAB — CBC WITH DIFFERENTIAL/PLATELET
BASOS PCT: 0 %
Basophils Absolute: 0 cells/uL (ref 0–200)
EOS ABS: 88 {cells}/uL (ref 15–500)
Eosinophils Relative: 2 %
HEMATOCRIT: 39.6 % (ref 35.0–45.0)
HEMOGLOBIN: 13.5 g/dL (ref 11.7–15.5)
LYMPHS PCT: 42 %
Lymphs Abs: 1848 cells/uL (ref 850–3900)
MCH: 31.3 pg (ref 27.0–33.0)
MCHC: 34.1 g/dL (ref 32.0–36.0)
MCV: 91.9 fL (ref 80.0–100.0)
MONO ABS: 220 {cells}/uL (ref 200–950)
MPV: 8.7 fL (ref 7.5–12.5)
Monocytes Relative: 5 %
Neutro Abs: 2244 cells/uL (ref 1500–7800)
Neutrophils Relative %: 51 %
Platelets: 147 10*3/uL (ref 140–400)
RBC: 4.31 MIL/uL (ref 3.80–5.10)
RDW: 13.8 % (ref 11.0–15.0)
WBC: 4.4 10*3/uL (ref 3.8–10.8)

## 2016-04-19 LAB — COMPLETE METABOLIC PANEL WITH GFR
ALT: 31 U/L — AB (ref 6–29)
AST: 24 U/L (ref 10–35)
Albumin: 4.2 g/dL (ref 3.6–5.1)
Alkaline Phosphatase: 86 U/L (ref 33–130)
BUN: 16 mg/dL (ref 7–25)
CHLORIDE: 107 mmol/L (ref 98–110)
CO2: 26 mmol/L (ref 20–31)
CREATININE: 0.67 mg/dL (ref 0.50–0.99)
Calcium: 8.9 mg/dL (ref 8.6–10.4)
GFR, Est Non African American: >89 mL/min (ref >=60)
Glucose, Bld: 92 mg/dL (ref 65–99)
Potassium: 4.2 mmol/L (ref 3.5–5.3)
Sodium: 140 mmol/L (ref 135–146)
Total Bilirubin: 0.7 mg/dL (ref 0.2–1.2)
Total Protein: 6.5 g/dL (ref 6.1–8.1)

## 2016-04-19 LAB — LIPID PANEL
CHOL/HDL RATIO: 4.5 ratio (ref <5.0)
Cholesterol: 207 mg/dL — ABNORMAL HIGH (ref <200)
HDL: 46 mg/dL — AB (ref >50)
LDL CALC: 132 mg/dL — AB (ref <100)
Triglycerides: 147 mg/dL (ref <150)
VLDL: 29 mg/dL (ref <30)

## 2016-04-19 LAB — TSH: TSH: 0.73 m[IU]/L

## 2016-04-19 MED ORDER — CLONAZEPAM 0.5 MG PO TABS: 0.5000 mg | ORAL_TABLET | Freq: Two times a day (BID) | ORAL | 0 refills | Status: DC | PRN

## 2016-04-19 NOTE — Progress Notes (Signed)
Brenda Terry, is a 65 y.o. female  ZOX:[id number]:[id number]  [id number]:[id number]  DOB - 06/10/1950  CC:  Chief Complaint  Patient presents with  . Hypertension  . Establish Care    had a fall and got a concussion about 5 weeks ago.   . Dizziness    gets sick from being dizzy.        HPI: Brenda HangMable Profit is a 65 y.o. female here to establish care. She about 5 weeks ago she fell and hit her head. She was seen in ED with negative imaging. She was told to follow-up here to establish care. She reports that she has been experiencing vertigo frequently since that time. She [place] the [place] and was prescribed meclizine. She reports it is not helping. She is vomiting frequently due to the vertigo. She has a history of HTN, GERD, neuropathy, fibromyalgia, Rest Leg Syndrome and Factor V Leiden. [place] She reports the only medications she is currently taking are gabapentin for neuropathy and Norco for her chronic pain.  She reports an abnormal EKG earlier this year and a recommendation that she see a cardiologist. She reports some type of GYN surgery in the past and feels things are out of place down there. She has not had her bp today.   Allergies  Allergen Reactions  . Depakote [Divalproex Sodium]     Makes pt have seizures  . Cyclobenzaprine Itching   Past Medical History:  Diagnosis Date  . Factor V Leiden (HCC)   . Fibromyalgia   . HLD (hyperlipidemia)   . Hypertension   . Restless leg syndrome    Current Outpatient Prescriptions on File Prior to Visit  Medication Sig Dispense Refill  . albuterol (PROVENTIL HFA;VENTOLIN HFA) 108 (90 Base) MCG/ACT inhaler Inhale 1 puff into the lungs every 6 (six) hours as needed for wheezing or shortness of breath. 1 Inhaler 1  . gabapentin (NEURONTIN) 100 MG capsule Take 200 mg by mouth at bedtime.    . hydrochlorothiazide (HYDRODIURIL) 25 MG tablet Take 25 mg by mouth daily.    Marland Kitchen.  HYDROcodone-acetaminophen (NORCO) 10-325 MG tablet Take 1 tablet by mouth every 4 (four) hours as needed for moderate pain.    Marland Kitchen. lansoprazole (PREVACID) 30 MG capsule Take 1 capsule (30 mg total) by mouth daily at 12 noon. 60 capsule 1  . rOPINIRole (REQUIP) 2 MG tablet Take 1 tablet (2 mg total) by mouth at bedtime. approx 1-2 hours before bedtime 30 tablet 0  . simvastatin (ZOCOR) 40 MG tablet Take 1 tablet (40 mg total) by mouth daily. 30 tablet 1   No current facility-administered medications on file prior to visit.    Family History  Problem Relation Age of Onset  . Heart attack Father [phone]    Deceased  . Heart attack Brother 40  . Heart attack Maternal Grandmother [phone]    Deceased   Social History   Social History  . Marital status: Married    Spouse name: N/A  . Number of children: N/A  . Years of education: N/A   Occupational History  . Not on file.   Social History Main Topics  . Smoking status: Never Smoker  . Smokeless tobacco: Never Used  . Alcohol use No  . Drug use: No  . Sexual activity: Not on file   Other Topics Concern  . Not on file   Social History Narrative  . No narrative on  file    Review of Systems: Constitutional: + fatigue Skin: Negative HENT: Negative  Eyes: + decreased vision Neck: Negative Respiratory: Negative Cardiovascular: chest pain in the past Gastrointestinal: Negative Genitourinary: Negative  Musculoskeletal: Negative   Neurological: + for fainting and headaches related to recent injury Hematological: + Reports easy bleeding and easy bruising Psychiatric/Behavioral: Negative    Objective:   Vitals:   04/19/16 0936  BP: (!) 162/70  Pulse: 66  Resp: 16  Temp: 98.2 F (36.8 C)    Physical Exam: Constitutional: Patient appears well-developed and well-nourished. No distress. HENT: Normocephalic, atraumatic, External right and left ear normal. Oropharynx is clear and moist.  Eyes: Conjunctivae and EOM are normal. PERRLA,  no scleral icterus. Neck: Normal ROM. Neck supple. No lymphadenopathy, No thyromegaly. CVS: RRR, S1/S2 +, no murmurs, no gallops, no rubs Pulmonary: Effort and breath sounds normal, no stridor, rhonchi, wheezes, rales.  Abdominal: Soft. Normoactive BS,, no distension, tenderness, rebound or guarding.  Musculoskeletal: Normal range of motion. No edema and no tenderness.  Neuro: Alert.Normal muscle tone coordination. Non-focal Skin: Skin is warm and dry. No rash noted. Not diaphoretic. No erythema. No pallor. Psychiatric: Normal mood and affect. Behavior, judgment, thought content normal.  Lab Results  Component Value Date   WBC 6.3 06/20/2015   HGB 13.4 06/20/2015   HCT 37.7 06/20/2015   MCV 89.5 06/20/2015   PLT 121 (L) 06/20/2015   Lab Results  Component Value Date   CREATININE 0.68 06/20/2015   BUN 14 06/19/2015   NA 141 06/19/2015   K 4.2 06/19/2015   CL 108 06/19/2015   CO2 23 06/19/2015    Lab Results  Component Value Date   HGBA1C 5.1 06/21/2015   Lipid Panel     Component Value Date/Time   CHOL 194 06/21/2015 0400   TRIG 241 (H) 06/21/2015 0400   HDL 32 (L) 06/21/2015 0400   CHOLHDL 6.1 06/21/2015 0400   VLDL 48 (H) 06/21/2015 0400   LDLCALC 114 (H) 06/21/2015 0400        Assessment and plan:   1. Screening for diabetes mellitus  - Hemoglobin A1c  2. Essential hypertension  - COMPLETE METABOLIC PANEL WITH GFR - CBC with Differential - Lipid panel - TSH  3. Screening for HIV (human immunodeficiency virus)  - HIV antibody (with reflex)  4. Encounter for hepatitis C screening test for low risk patient  - Hepatitis C Antibody  5. Screening for breast cancer  - MM Digital Screening; Future  6. Special screening for malignant neoplasms, colon  - Ambulatory referral to Gastroenterology  7. Hereditary and idiopathic peripheral neuropathy  - Ambulatory referral to Neurology  8. Congenital factor VIII disorder (HCC)  - Ambulatory referral to  Hematology  9. Chest pain, unspecified type  - Ambulatory referral to Cardiology  10 Hypertension -Continue Hctz 25 mg.   Return in about 3 months (around 07/20/2016).  The patient was given clear instructions to go to ER or return to medical center if symptoms don't improve, worsen or new problems develop. The patient verbalized understanding.    Henrietta HooverLinda C Helen Cuff FNP  04/19/2016, 11:38 AM

## 2016-04-20 ENCOUNTER — Telehealth: Payer: Self-pay | Admitting: *Deleted

## 2016-04-20 ENCOUNTER — Telehealth: Payer: Self-pay | Admitting: General Practice

## 2016-04-20 LAB — HIV ANTIBODY (ROUTINE TESTING W REFLEX): HIV 1&2 Ab, 4th Generation: NONREACTIVE

## 2016-04-20 LAB — HEPATITIS C ANTIBODY: HCV Ab: NEGATIVE

## 2016-04-20 LAB — HEMOGLOBIN A1C
HEMOGLOBIN A1C: 4.9 % (ref <5.7)
MEAN PLASMA GLUCOSE: 94 mg/dL

## 2016-04-20 NOTE — Telephone Encounter (Signed)
"  I'm returning a call from [place].  I was instructed to call [phone] to provide more information to schedule an appointment."  Call transferred ext [phone].

## 2016-04-20 NOTE — Telephone Encounter (Signed)
Patient called to return a call she received on 11/13. Patient states she was called by a Clydie BraunKaren or Huntley DecSara.

## 2016-04-21 NOTE — Progress Notes (Signed)
Cardiology Office Note   Date:  04/22/2016   ID:  Brenda Terry, DOB 03/03/1951, MRN [id number]  PCP:  Concepcion LivingBERNHARDT, LINDA, NP  Cardiologist:   Charlton HawsPeter Nishan, MD   Chief Complaint  Patient presents with  . Chest Pain      History of Present Illness: Brenda Terry is a 65 y.o. female who presents for evaluation of chest pain Referred from [facility]  Beginning of October fell and hit head with 2 ER visits imaging ok but vertigo and post concussive symptoms She has a history of HTN, GERD, neuropathy, fibromyalgia, Rest Leg Syndrome and Factor V Leiden. Not on coumadin now  She also reports a strong history of CAD. Her father passed away from a MI at age 63 and her maternal grandmother passed away in her 6060's from an MI. Her brother is currently living but had a MI in his 940's.  [place]  Told before she has an abnormal ECG and should see cardiologist   Seen in hospital by Dr Patty SermonsBrackbill 06/19/15  Chest pain R/O no acute ECG changes   Cath 06/20/15 Dr Abe PeopleVaransi  Normal coronary arteries normal EF   She seems to think there was an issue with her heart rhythm in hospital With mention of pacer but I see no mention of this in any of the notes  Past Medical History:  Diagnosis Date  . Factor V Leiden (HCC)   . Fibromyalgia   . HLD (hyperlipidemia)   . Hypertension   . Restless leg syndrome     Past Surgical History:  Procedure Laterality Date  . ABDOMINAL HYSTERECTOMY    . ABDOMINAL SURGERY    . APPENDECTOMY    . CARDIAC CATHETERIZATION N/A 06/20/2015   Procedure: Left Heart Cath and Coronary Angiography;  Surgeon: Corky CraftsJayadeep S Varanasi, MD;  [facility];  Service: Cardiovascular;  Laterality: N/A;  . CESAREAN SECTION    . CHOLECYSTECTOMY       Current Outpatient Prescriptions  Medication Sig Dispense Refill  . albuterol (PROVENTIL HFA;VENTOLIN HFA) 108 (90 Base) MCG/ACT inhaler Inhale 1 puff into the lungs every 6 (six) hours  as needed for wheezing or shortness of breath. 1 Inhaler 1  . clonazePAM (KLONOPIN) 0.5 MG tablet Take 1 tablet (0.5 mg total) by mouth 2 (two) times daily as needed for anxiety. 30 tablet 0  . gabapentin (NEURONTIN) 100 MG capsule Take 200 mg by mouth at bedtime.    Marland Kitchen. HYDROcodone-acetaminophen (NORCO) 10-325 MG tablet Take 1 tablet by mouth every 4 (four) hours as needed for moderate pain.    . hydrochlorothiazide (HYDRODIURIL) 25 MG tablet Take 0.5 tablets (12.5 mg total) by mouth daily. 45 tablet 3  . simvastatin (ZOCOR) 40 MG tablet Take 1 tablet (40 mg total) by mouth daily. (Patient not taking: Reported on 04/22/2016) 30 tablet 1   No current facility-administered medications for this visit.     Allergies:   Depakote [divalproex sodium] and Cyclobenzaprine    Social History:  The patient  reports that she has never smoked. She has never used smokeless tobacco. She reports that she does not drink alcohol or use drugs.   Family History:  The patient's family history includes Heart attack (age of onset: 7340) in her brother; Heart attack (age of onset: 2251) in her father; Heart attack (age of onset: 560) in her maternal grandmother.    ROS:  Please see the history of present illness.  Otherwise, review of systems are positive for none.   All other systems are reviewed and negative.    PHYSICAL EXAM: VS:  BP (!) 150/74   Pulse 81   Ht 5' (1.524 m)   Wt 79.3 kg (174 lb 12.8 oz)   SpO2 98%   BMI 34.14 kg/m  , BMI Body mass index is 34.14 kg/m. Affect appropriate Overweight white female  HEENT: normal Neck supple with no adenopathy JVP normal no bruits no thyromegaly Lungs clear with no wheezing and good diaphragmatic motion Heart:  S1/S2 SEM  murmur, no rub, gallop or click PMI normal Abdomen: benighn, BS positve, no tenderness, no AAA no bruit.  No HSM or HJR Distal pulses intact with no bruits No edema Neuro non-focal Skin warm and dry No muscular weakness    EKG:   06/22/15 SR rate 78 normal  ECG    Recent Labs: 04/19/2016: ALT 31; BUN 16; Creat 0.67; Hemoglobin 13.5; Platelets 147; Potassium 4.2; Sodium 140; TSH 0.73    Lipid Panel    Component Value Date/Time   CHOL 207 (H) 04/19/2016 1008   TRIG 147 04/19/2016 1008   HDL 46 (L) 04/19/2016 1008   CHOLHDL 4.5 04/19/2016 1008   VLDL 29 04/19/2016 1008   LDLCALC 132 (H) 04/19/2016 1008      Wt Readings from Last 3 Encounters:  04/22/16 79.3 kg (174 lb 12.8 oz)  04/19/16 78.5 kg (173 lb)  06/20/15 77.1 kg (169 lb 15.6 oz)      Other studies Reviewed: Additional studies/ records that were reviewed today include: Notes Dr Patty SermonsBrackbill Dr [place] and cath from Dr Eldridge DaceVaranasi notes from primary .    ASSESSMENT AND PLAN:  1. Chest pain : recent cath with no CAD no need for further w/u 2. Dyspnea : functional EF normal LVEDP normal cath f/u echo given murmur 3. HTN:  Had stopped meds due to insurance issues have called back in  HCTZ 12.5 mg daily can f/u with primary for this 4. Firbromyalgia: may contribute to chest pain continue norco klonopin 5. Chol: on statin labs with primar y   Current medicines are reviewed at length with the patient today.  The patient does not have concerns regarding medicines.  The following changes have been made:  HCTZ 12.5 mg daily   Labs/ tests ordered today include: Echo   Orders Placed This Encounter  Procedures  . ECHOCARDIOGRAM COMPLETE     Disposition:   FU with PRN      Signed, Charlton HawsPeter Nishan, MD  04/22/2016 3:32 PM    [facility] [address], [place], [place]  [address] Phone: [phone]; Fax: [phone]

## 2016-04-22 ENCOUNTER — Ambulatory Visit (INDEPENDENT_AMBULATORY_CARE_PROVIDER_SITE_OTHER): Payer: Medicaid Other | Admitting: Cardiovascular Disease

## 2016-04-22 ENCOUNTER — Encounter: Payer: Self-pay | Admitting: Cardiovascular Disease

## 2016-04-22 VITALS — BP 150/74 | HR 81 | Ht 60.0 in | Wt 174.8 lb

## 2016-04-22 DIAGNOSIS — R0789 Other chest pain: Secondary | ICD-10-CM

## 2016-04-22 DIAGNOSIS — R011 Cardiac murmur, unspecified: Secondary | ICD-10-CM | POA: Diagnosis not present

## 2016-04-22 DIAGNOSIS — R0600 Dyspnea, unspecified: Secondary | ICD-10-CM

## 2016-04-22 MED ORDER — HYDROCHLOROTHIAZIDE 25 MG PO TABS: 12.5000 mg | ORAL_TABLET | Freq: Every day | ORAL | 3 refills | Status: DC

## 2016-04-22 MED ORDER — SIMVASTATIN 40 MG PO TABS: 40.0000 mg | ORAL_TABLET | Freq: Every day | ORAL | 3 refills | Status: DC

## 2016-04-22 NOTE — Patient Instructions (Addendum)
Medication Instructions:  Your physician recommends that you continue on your current medications as directed. Please refer to the Current Medication list given to you today.  Labwork: NONE  Testing/Procedures: Your physician has requested that you have an echocardiogram. Echocardiography is a painless test that uses sound waves to create images of your heart. It provides your doctor with information about the size and shape of your heart and how well your heart's chambers and valves are working. This procedure takes approximately one hour. There are no restrictions for this procedure.  Follow-Up: Your physician wants you to follow-up with Dr. Nishan as needed.   If you need a refill on your cardiac medications before your next appointment, please call your pharmacy.    

## 2016-04-22 NOTE — Addendum Note (Signed)
Addended by: Virl AxePATE INGALLS, Skyra Crichlow L on: 04/22/2016 03:35 PM   Modules accepted: Orders

## 2016-04-22 NOTE — Addendum Note (Signed)
Addended by: Virl AxePATE INGALLS, Taison Celani L on: 04/22/2016 03:44 PM   Modules accepted: Orders

## 2016-04-23 ENCOUNTER — Other Ambulatory Visit: Payer: Self-pay

## 2016-04-23 MED ORDER — SIMVASTATIN 40 MG PO TABS: 40.0000 mg | ORAL_TABLET | Freq: Every day | ORAL | 11 refills | Status: DC

## 2016-04-23 MED ORDER — HYDROCHLOROTHIAZIDE 12.5 MG PO TABS: 12.5000 mg | ORAL_TABLET | Freq: Every day | ORAL | 11 refills | Status: DC

## 2016-05-04 ENCOUNTER — Encounter: Payer: Self-pay | Admitting: Neurology

## 2016-05-04 ENCOUNTER — Other Ambulatory Visit: Payer: Self-pay | Admitting: Family Medicine

## 2016-05-04 ENCOUNTER — Ambulatory Visit (INDEPENDENT_AMBULATORY_CARE_PROVIDER_SITE_OTHER): Payer: Medicaid Other | Admitting: Neurology

## 2016-05-04 VITALS — BP 158/88 | HR 69 | Ht 60.0 in | Wt 173.2 lb

## 2016-05-04 DIAGNOSIS — D6851 Activated protein C resistance: Secondary | ICD-10-CM

## 2016-05-04 DIAGNOSIS — F0781 Postconcussional syndrome: Secondary | ICD-10-CM | POA: Diagnosis not present

## 2016-05-04 DIAGNOSIS — M797 Fibromyalgia: Secondary | ICD-10-CM | POA: Insufficient documentation

## 2016-05-04 DIAGNOSIS — S060X9D Concussion with loss of consciousness of unspecified duration, subsequent encounter: Secondary | ICD-10-CM | POA: Diagnosis not present

## 2016-05-04 DIAGNOSIS — R42 Dizziness and giddiness: Secondary | ICD-10-CM

## 2016-05-04 DIAGNOSIS — R202 Paresthesia of skin: Secondary | ICD-10-CM | POA: Diagnosis not present

## 2016-05-04 DIAGNOSIS — G2581 Restless legs syndrome: Secondary | ICD-10-CM

## 2016-05-04 MED ORDER — PRAMIPEXOLE DIHYDROCHLORIDE 0.125 MG PO TABS: 0.1250 mg | ORAL_TABLET | Freq: Two times a day (BID) | ORAL | 1 refills | Status: DC
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
## 2016-05-06 LAB — SEDIMENTATION RATE: Sed Rate: 11 mm/hr (ref 0–40)

## 2016-05-06 LAB — FERRITIN: FERRITIN: 161 ng/mL — AB (ref 15–150)

## 2016-05-06 LAB — B. BURGDORFI ANTIBODIES

## 2016-05-06 LAB — ANGIOTENSIN CONVERTING ENZYME: Angio Convert Enzyme: 49 U/L (ref 14–82)

## 2016-05-06 LAB — ANA W/REFLEX: Anti Nuclear Antibody(ANA): NEGATIVE

## 2016-05-06 LAB — RPR: RPR Ser Ql: NONREACTIVE

## 2016-05-07 ENCOUNTER — Other Ambulatory Visit: Payer: Medicaid Other

## 2016-05-10 ENCOUNTER — Telehealth: Payer: Self-pay

## 2016-05-10 NOTE — Telephone Encounter (Signed)
Called pt w/ lab results. Says that she is not currently taking iron supplement or vitamins. Verbalized understanding and appreciation for call.

## 2016-05-10 NOTE — Telephone Encounter (Signed)
-----   Message from York Spanielharles K Willis, MD sent at 05/06/2016  8:28 AM EST -----  The blood work results are unremarkable. Ferritin level is actually high. No need for Iron replacement. Please call the patient. ----- Message ----- From: Nell RangeInterface, [facility] Lab Results In Sent: 05/05/2016   7:43 AM To: York Spanielharles K Willis, MD

## 2016-05-19 ENCOUNTER — Ambulatory Visit (HOSPITAL_COMMUNITY): Payer: Medicaid Other | Attending: Cardiovascular Disease

## 2016-05-19 ENCOUNTER — Other Ambulatory Visit: Payer: Self-pay

## 2016-05-19 DIAGNOSIS — R0600 Dyspnea, unspecified: Secondary | ICD-10-CM | POA: Diagnosis not present

## 2016-05-19 DIAGNOSIS — R011 Cardiac murmur, unspecified: Secondary | ICD-10-CM | POA: Diagnosis not present

## 2016-05-19 DIAGNOSIS — R0789 Other chest pain: Secondary | ICD-10-CM | POA: Diagnosis not present

## 2016-06-02 ENCOUNTER — Inpatient Hospital Stay: Admission: RE | Admit: 2016-06-02 | Payer: Medicaid Other | Source: Ambulatory Visit

## 2016-06-10 ENCOUNTER — Telehealth: Payer: Self-pay | Admitting: Hematology

## 2016-06-10 NOTE — Telephone Encounter (Signed)
Patient called to verify appointment and I did not see an upcoming appointment. Patient said it had been changed once and was calling to confirm.  If someone would please call her back it would be greatly appriciated

## 2016-06-15 ENCOUNTER — Encounter: Payer: Medicaid Other | Admitting: Hematology

## 2016-06-23 ENCOUNTER — Telehealth: Payer: Self-pay | Admitting: Hematology

## 2016-06-23 NOTE — Telephone Encounter (Signed)
Opened in error message to HIM/NP

## 2016-07-02 ENCOUNTER — Telehealth: Payer: Self-pay | Admitting: Hematology

## 2016-07-02 NOTE — Telephone Encounter (Signed)
Lt vm regarding hem appt and scheduling

## 2016-07-09 ENCOUNTER — Telehealth: Payer: Self-pay | Admitting: Hematology

## 2016-07-09 NOTE — Telephone Encounter (Signed)
Pt called in to schedule a hem appt, referred back in Nov for Factor V work up. Schedule w/ Kale for 02/19. Mailed pt letter.

## 2016-07-20 ENCOUNTER — Ambulatory Visit
Admission: RE | Admit: 2016-07-20 | Discharge: 2016-07-20 | Disposition: A | Discharge status: Home / Self Care | Payer: Medicaid Other | Source: Ambulatory Visit | Attending: Neurology | Admitting: Neurology

## 2016-07-20 DIAGNOSIS — R202 Paresthesia of skin: Secondary | ICD-10-CM

## 2016-07-20 DIAGNOSIS — S060X9D Concussion with loss of consciousness of unspecified duration, subsequent encounter: Secondary | ICD-10-CM

## 2016-07-20 DIAGNOSIS — G2581 Restless legs syndrome: Secondary | ICD-10-CM

## 2016-07-23 ENCOUNTER — Telehealth: Payer: Self-pay | Admitting: Neurology

## 2016-07-23 NOTE — Telephone Encounter (Signed)
  I called patient. MRI the brain is normal. I discussed this with patient.   MRI brain 07/24/16:  IMPRESSION:  Unremarkable MRI scan of the brain without contrast .

## 2016-07-26 ENCOUNTER — Encounter: Payer: Medicaid Other | Admitting: Hematology

## 2016-07-26 ENCOUNTER — Encounter: Payer: Self-pay | Admitting: Hematology

## 2016-07-27 NOTE — Progress Notes (Signed)
This encounter was created in error - please disregard.

## 2016-08-02 ENCOUNTER — Ambulatory Visit (INDEPENDENT_AMBULATORY_CARE_PROVIDER_SITE_OTHER): Payer: Medicaid Other | Admitting: Family Medicine

## 2016-08-02 ENCOUNTER — Encounter: Payer: Self-pay | Admitting: Family Medicine

## 2016-08-02 VITALS — BP 134/70 | HR 84 | Temp 98.0°F | Resp 14 | Ht 60.0 in | Wt 170.0 lb

## 2016-08-02 DIAGNOSIS — G629 Polyneuropathy, unspecified: Secondary | ICD-10-CM | POA: Diagnosis not present

## 2016-08-02 DIAGNOSIS — I10 Essential (primary) hypertension: Secondary | ICD-10-CM | POA: Diagnosis not present

## 2016-08-02 DIAGNOSIS — F419 Anxiety disorder, unspecified: Secondary | ICD-10-CM | POA: Diagnosis not present

## 2016-08-02 DIAGNOSIS — K219 Gastro-esophageal reflux disease without esophagitis: Secondary | ICD-10-CM | POA: Diagnosis not present

## 2016-08-02 MED ORDER — OMEPRAZOLE 20 MG PO CPDR: 20.0000 mg | DELAYED_RELEASE_CAPSULE | Freq: Every day | ORAL | 3 refills | Status: DC

## 2016-08-02 NOTE — Progress Notes (Signed)
Brenda Terry, is a 66 y.o. female  ZOX:[id number]:[id number]  WJX:[id number] [id number]  DOB - 12/30/1950  CC:  Chief Complaint  Patient presents with  . Cough    ? reflux issues, feels like she is choking and there is a gurgling noise when she is sleeping, but it is the cough that wakes her up  . Follow-up    continued leg pain, sleepy during the day       HPI: Brenda Terry is a 66 y.o. female here to establish care   Allergies  Allergen Reactions  . Depakote [Divalproex Sodium]     Makes pt have seizures  . Cyclobenzaprine Itching   Past Medical History:  Diagnosis Date  . Factor V Leiden (HCC)   . Fibromyalgia   . HLD (hyperlipidemia)   . Hypertension   . Migraine   . Restless leg syndrome    Current Outpatient Prescriptions on File Prior to Visit  Medication Sig Dispense Refill  . albuterol (PROVENTIL HFA;VENTOLIN HFA) 108 (90 Base) MCG/ACT inhaler Inhale 1 puff into the lungs every 6 (six) hours as needed for wheezing or shortness of breath. 1 Inhaler 1  . clonazePAM (KLONOPIN) 0.5 MG tablet Take 1 tablet (0.5 mg total) by mouth 2 (two) times daily as needed for anxiety. 30 tablet 0  . gabapentin (NEURONTIN) 300 MG capsule Take 300 mg by mouth at bedtime.    Marland Kitchen. HYDROcodone-acetaminophen (NORCO) 10-325 MG tablet Take 1 tablet by mouth every 4 (four) hours as needed for moderate pain.    . hydrochlorothiazide (HYDRODIURIL) 12.5 MG tablet Take 1 tablet (12.5 mg total) by mouth daily. (Patient not taking: Reported on 07/26/2016) 30 tablet 11  . pramipexole (MIRAPEX) 0.125 MG tablet Take 1 tablet (0.125 mg total) by mouth 2 (two) times daily. (Patient not taking: Reported on 07/26/2016) 60 tablet 1  . simvastatin (ZOCOR) 40 MG tablet Take 1 tablet (40 mg total) by mouth daily at 6 PM. (Patient not taking: Reported on 07/26/2016) 30 tablet 11   No current facility-administered medications on file prior to visit.    Family History  Problem Relation Age of Onset  . Heart attack Father 4651   Deceased  . Heart attack Brother 40  . Heart attack Maternal Grandmother 60    Deceased  . Heart disease Mother    Social History   Social History  . Marital status: Married    Spouse name: N/A  . Number of children: 7  . Years of education: Some college   Occupational History  . N/A    Social History Main Topics  . Smoking status: Never Smoker  . Smokeless tobacco: Never Used  . Alcohol use No  . Drug use: No  . Sexual activity: Not on file   Other Topics Concern  . Not on file   Social History Narrative   Lives at home w/ her husband, 2 sons, dtr in Social workerlaw and 2 granddchildren   Right-handed   Caffeine: 2 sodas per day    Review of Systems: Constitutional: +fatigue Skin: Negative HENT: Negative  Eyes: Negative  Neck: Negative Respiratory: Negative Cardiovascular: Negative Gastrointestinal: +nighttime coughing and gurgling Genitourinary: Negative  Musculoskeletal: Negative   Neurological: + occ headaches,leg pain, dizziness, tremors. Hematological: Negative  Psychiatric/Behavioral: Negative    Objective:   Vitals:   08/02/16 1109  BP: 134/70  Pulse: 84  Resp: 14  Temp: 98 F (36.7 C)    Physical Exam: Constitutional: Patient appears well-developed and well-nourished. No distress. HENT: Normocephalic,  atraumatic, External right and left ear normal. Oropharynx is clear and moist.  Eyes: Conjunctivae and EOM are normal. PERRLA, no scleral icterus. Neck: Normal ROM. Neck supple. No lymphadenopathy, No thyromegaly. CVS: RRR, S1/S2 +, no murmurs, no gallops, no rubs Pulmonary: Effort and breath sounds normal, no stridor, rhonchi, wheezes, rales.  Abdominal: Soft. Normoactive BS,, no distension, tenderness, rebound or guarding.  Musculoskeletal: Normal range of motion. No edema and no tenderness.  Neuro: Alert.Normal muscle tone coordination. Non-focal Skin: Skin is warm and dry. No rash noted. Not diaphoretic. No erythema. No pallor. Psychiatric: Normal  mood and affect. Behavior, judgment, thought content normal.  Lab Results  Component Value Date   WBC 4.4 04/19/2016   HGB 13.5 04/19/2016   HCT 39.6 04/19/2016   MCV 91.9 04/19/2016   PLT 147 04/19/2016   Lab Results  Component Value Date   CREATININE 0.67 04/19/2016   BUN 16 04/19/2016   NA 140 04/19/2016   K 4.2 04/19/2016   CL 107 04/19/2016   CO2 26 04/19/2016    Lab Results  Component Value Date   HGBA1C 4.9 04/19/2016   Lipid Panel     Component Value Date/Time   CHOL 207 (H) 04/19/2016 1008   TRIG 147 04/19/2016 1008   HDL 46 (L) 04/19/2016 1008   CHOLHDL 4.5 04/19/2016 1008   VLDL 29 04/19/2016 1008   LDLCALC 132 (H) 04/19/2016 1008        Assessment and plan:   1. Gastroesophageal reflux disease, esophagitis presence not specified  - omeprazole (PRILOSEC) 20 MG capsule; Take 1 capsule (20 mg total) by mouth daily.  Dispense: 30 capsule; Refill: 3  2. Essential hypertension -follow-up with cardiologist  3. Neuropathy (HCC) -Refill gabapentin.   4. Anxiety Refill klopinin   Return in about 6 months (around 01/30/2017).  The patient was given clear instructions to go to ER or return to medical center if symptoms don't improve, worsen or new problems develop. The patient verbalized understanding.    Henrietta Hoover FNP  08/02/2016, 11:52 AM

## 2016-08-02 NOTE — Patient Instructions (Signed)
Let us know if you continue to have periods of apnea or if new medication is not helping with nighttime symptoms.

## 2016-08-04 ENCOUNTER — Ambulatory Visit: Payer: Medicaid Other | Admitting: Adult Health

## 2016-08-12 ENCOUNTER — Other Ambulatory Visit: Payer: Self-pay | Admitting: Family Medicine

## 2016-08-13 NOTE — Telephone Encounter (Signed)
Linda please advise. Thanks!  

## 2016-08-16 ENCOUNTER — Other Ambulatory Visit: Payer: Self-pay | Admitting: Family Medicine

## 2016-08-16 MED ORDER — CLONAZEPAM 0.5 MG PO TABS: 0.5000 mg | ORAL_TABLET | Freq: Two times a day (BID) | ORAL | 0 refills | Status: DC | PRN

## 2016-08-16 NOTE — Telephone Encounter (Signed)
Bonita QuinLinda, please advise. Thanks!

## 2016-09-07 ENCOUNTER — Other Ambulatory Visit: Payer: Self-pay | Admitting: Neurology

## 2016-10-09 ENCOUNTER — Other Ambulatory Visit: Payer: Self-pay | Admitting: Family Medicine

## 2016-11-10 ENCOUNTER — Other Ambulatory Visit: Payer: Self-pay | Admitting: Neurology

## 2016-12-29 ENCOUNTER — Other Ambulatory Visit: Payer: Self-pay | Admitting: Neurology

## 2017-01-05 ENCOUNTER — Ambulatory Visit (INDEPENDENT_AMBULATORY_CARE_PROVIDER_SITE_OTHER): Payer: Medicaid Other | Admitting: Family Medicine

## 2017-01-05 ENCOUNTER — Encounter: Payer: Self-pay | Admitting: Family Medicine

## 2017-01-05 VITALS — BP 164/86 | HR 74 | Temp 98.2°F | Resp 16 | Ht 60.0 in | Wt 174.0 lb

## 2017-01-05 DIAGNOSIS — Z131 Encounter for screening for diabetes mellitus: Secondary | ICD-10-CM | POA: Diagnosis not present

## 2017-01-05 DIAGNOSIS — M25512 Pain in left shoulder: Secondary | ICD-10-CM | POA: Diagnosis not present

## 2017-01-05 DIAGNOSIS — M25511 Pain in right shoulder: Secondary | ICD-10-CM

## 2017-01-05 DIAGNOSIS — K2100 Gastro-esophageal reflux disease with esophagitis, without bleeding: Secondary | ICD-10-CM

## 2017-01-05 DIAGNOSIS — R251 Tremor, unspecified: Secondary | ICD-10-CM | POA: Diagnosis not present

## 2017-01-05 DIAGNOSIS — R071 Chest pain on breathing: Secondary | ICD-10-CM | POA: Diagnosis not present

## 2017-01-05 DIAGNOSIS — W57XXXD Bitten or stung by nonvenomous insect and other nonvenomous arthropods, subsequent encounter: Secondary | ICD-10-CM | POA: Diagnosis not present

## 2017-01-05 DIAGNOSIS — R42 Dizziness and giddiness: Secondary | ICD-10-CM | POA: Diagnosis not present

## 2017-01-05 DIAGNOSIS — I10 Essential (primary) hypertension: Secondary | ICD-10-CM

## 2017-01-05 LAB — CBC WITH DIFFERENTIAL/PLATELET
Basophils Absolute: 0 cells/uL (ref 0–200)
Basophils Relative: 0 %
EOS ABS: 53 {cells}/uL (ref 15–500)
Eosinophils Relative: 1 %
HEMATOCRIT: 41.4 % (ref 35.0–45.0)
Hemoglobin: 14 g/dL (ref 11.7–15.5)
LYMPHS PCT: 38 %
Lymphs Abs: 2014 cells/uL (ref 850–3900)
MCH: 31.7 pg (ref 27.0–33.0)
MCHC: 33.8 g/dL (ref 32.0–36.0)
MCV: 93.9 fL (ref 80.0–100.0)
MONO ABS: 265 {cells}/uL (ref 200–950)
MPV: 9.3 fL (ref 7.5–12.5)
Monocytes Relative: 5 %
NEUTROS PCT: 56 %
Neutro Abs: 2968 cells/uL (ref 1500–7800)
Platelets: 163 10*3/uL (ref 140–400)
RBC: 4.41 MIL/uL (ref 3.80–5.10)
RDW: 13.4 % (ref 11.0–15.0)
WBC: 5.3 10*3/uL (ref 3.8–10.8)

## 2017-01-05 LAB — POCT URINALYSIS DIP (DEVICE)
BILIRUBIN URINE: NEGATIVE
GLUCOSE, UA: NEGATIVE mg/dL
Hgb urine dipstick: NEGATIVE
Ketones, ur: NEGATIVE mg/dL
Nitrite: NEGATIVE
Protein, ur: NEGATIVE mg/dL
SPECIFIC GRAVITY, URINE: 1.02 (ref 1.005–1.030)
UROBILINOGEN UA: 1 mg/dL (ref 0.0–1.0)
pH: 7 (ref 5.0–8.0)

## 2017-01-05 LAB — POCT GLYCOSYLATED HEMOGLOBIN (HGB A1C): Hemoglobin A1C: 4.8

## 2017-01-05 MED ORDER — GABAPENTIN 300 MG PO CAPS: 300.0000 mg | ORAL_CAPSULE | Freq: Every day | ORAL | 3 refills | Status: AC

## 2017-01-05 MED ORDER — PROPRANOLOL HCL 40 MG PO TABS: 40.0000 mg | ORAL_TABLET | Freq: Two times a day (BID) | ORAL | 1 refills | Status: DC

## 2017-01-05 MED ORDER — LOSARTAN POTASSIUM-HCTZ 50-12.5 MG PO TABS: 1.0000 | ORAL_TABLET | Freq: Every day | ORAL | 1 refills | Status: DC

## 2017-01-05 NOTE — Progress Notes (Signed)
Patient ID: Brenda Terry, female    DOB: 03/24/1951, 66 y.o.   MRN: [id number]  PCP: Bing NeighborsHarris, Laylonie Marzec S, FNP  Chief Complaint  Patient presents with  . Fatigue  . Edema    right side neck  . Stabbing pain in back    Subjective:  HPI Brenda Terry is a 66 y.o. female presents for evaluation of fatigue, neck swelling and back pain.  Medical history is significant for Medication Non-adherence, GERD, Essential Hypertension, Neuropathy, Anxiety, and Fibromyalgia. Today,  Brenda Terry complains of right neck pain. She reports that a Brenda Terry advised her that her neck appeared to be swelling. Since that time, she has experienced sensation of choking, stabbing shoulder pain that radiates around her chest and dizziness. Brenda Terry has a history of GERD, although she is not taking any medication presently for symptoms. In addition, Brenda Terry reports recent tick bites and she is concern that she may have lyme disease. he recently and is currently Brenda Terry reports a recent tick bites. She has experienced worsening fatigue. She states that  "It feels like I am swelling up in my upper chest". Brenda Terry has experienced this sensation a few times over the last couple of weeks. Brenda Terry complains of pronounced sleepiness in spite of receiving an average of 6-7 hours of nighttime sleep. Uncertain of snoring history and apneic episodes. She admits to nonadherence with previously prescribed antihypertension medication. She doesn't routinely monitor her  blood pressure and therefore is unaware of her baseline reading. Social History   Social History  . Marital status: Married    Spouse name: N/A  . Number of children: 7  . Years of education: Some college   Occupational History  . N/A    Social History Main Topics  . Smoking status: Never Smoker  . Smokeless tobacco: Never Used  . Alcohol use No  . Drug use: No  . Sexual activity: Not on file   Other Topics Concern  . Not on file   Social History Narrative   Lives at home w/ her  husband, 2 sons, dtr in Social workerlaw and 2 granddchildren   Right-handed   Caffeine: 2 sodas per day    Family History  Problem Relation Age of Onset  . Heart attack Father [id number]       Deceased  . Heart attack Brother 40  . Heart attack Maternal Grandmother 60       Deceased  . Heart disease Mother    Review of Systems See HPI  Patient Active Problem List   Diagnosis Date Noted  . Fibromyalgia 05/04/2016  . 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 Hereditary and idiopathic peripheral neuropathy   . Esophageal reflux   . RLS (restless legs syndrome)   . Chest pain 06/20/2015  . Benign essential HTN 06/20/2015  . HLD (hyperlipidemia) 06/20/2015  . Faintness     Allergies  Allergen Reactions  . Depakote [Divalproex Sodium]     Makes pt have seizures  . Cyclobenzaprine Itching    Prior to Admission medications   Medication Sig Start Date End Date Taking? Authorizing Provider  gabapentin (NEURONTIN) 300 MG capsule Take 300 mg by mouth at bedtime.   Yes [provider]  HYDROcodone-acetaminophen (NORCO) 10-325 MG tablet Take 1 tablet by mouth every 4 (four) hours as needed for moderate pain.   Yes [provider]  pramipexole (MIRAPEX) 0.125 MG tablet TAKE 1 TABLET (0.125 MG TOTAL) BY MOUTH 2 (TWO) TIMES DAILY. 12/29/16  Yes York SpanielWillis, Charles K, MD  albuterol (PROVENTIL  HFA;VENTOLIN HFA) 108 (90 Base) MCG/ACT inhaler Inhale 1 puff into the lungs every 6 (six) hours as needed for wheezing or shortness of breath. Patient not taking: Reported on 01/05/2017 06/21/15   Vassie LollMadera, Carlos, MD  clonazePAM (KLONOPIN) 0.5 MG tablet Take 1 tablet (0.5 mg total) by mouth 2 (two) times daily as needed for anxiety. Patient not taking: Reported on 01/05/2017 08/16/16   Henrietta HooverBernhardt, Linda C, NP  hydrochlorothiazide (HYDRODIURIL) 12.5 MG tablet Take 1 tablet (12.5 mg total) by mouth daily. Patient not taking: Reported on 07/26/2016 04/23/16   Wendall StadeNishan, Peter C, MD  omeprazole (PRILOSEC) 20 MG capsule Take  1 capsule (20 mg total) by mouth daily. Patient not taking: Reported on 01/05/2017 08/02/16   Henrietta HooverBernhardt, Linda C, NP  simvastatin (ZOCOR) 40 MG tablet Take 1 tablet (40 mg total) by mouth daily at 6 PM. Patient not taking: Reported on 07/26/2016 04/23/16   Wendall StadeNishan, Peter C, MD    Past Medical, Surgical Family and Social History reviewed and updated.    Objective:   Today's Vitals   01/05/17 0947  BP: (!) 164/86  Pulse: 74  Resp: 16  Temp: 98.2 F (36.8 C)  TempSrc: Oral  SpO2: 99%  Weight: 174 lb (78.9 kg)  Height: 5' (1.524 m)    Wt Readings from Last 3 Encounters:  01/05/17 174 lb (78.9 kg)  08/02/16 170 lb (77.1 kg)  07/26/16 172 lb 1.6 oz (78.1 kg)   Physical Exam  Constitutional: She is oriented to person, place, and time. She appears well-developed and well-nourished.  HENT:  Head: Normocephalic and atraumatic.  Right Ear: External ear normal.  Left Ear: External ear normal.  Nose: Nose normal.  Mouth/Throat: Oropharynx is clear and moist.  Neck: Normal range of motion. Neck supple. No thyromegaly present.  Cardiovascular: Normal rate, regular rhythm, normal heart sounds and intact distal pulses.   Pulmonary/Chest: Effort normal and breath sounds normal.  Abdominal: Soft. Bowel sounds are normal. She exhibits no distension and no mass. There is no tenderness. There is no rebound and no guarding.  Musculoskeletal: Normal range of motion.  Lymphadenopathy:    She has no cervical adenopathy.  Neurological: She is alert and oriented to person, place, and time. She has normal reflexes.  Skin: Skin is warm and dry.  Psychiatric: She has a normal mood and affect. Her behavior is normal. Judgment and thought content normal.   Assessment & Plan:  1. Essential hypertension, uncontrolled  - EKG 12-Lead - Ambulatory referral to Cardiology -Resume Losartan-Hydrochlorothiazide 50 mg -12.5 mg once daily.   2. Tick bite, subsequent encounter - Lyme Aby, Western Blot IgG & IgM  w/bands  3. Screening for diabetes mellitus - COMPLETE METABOLIC PANEL WITH GFR - POCT glycosylated hemoglobin (Hb A1C), 4.8, normal   4. Dizziness, to rule out anemia. - CBC with Differential  5. Acute pain of both shoulders -Gabapentin 300 mg daily at bedtime for should pain.   6. Chest pain on breathing, this is possibly related to a chronic history of anxiety. EKG was unremarkable. Patient has a history left heart catheterization. Will refer to cardiology for additional evaluation.   7. Occasional Tremors  -Start propranolol 40 mg twice daily as needed for tremors.   8. GERD, with esophagitis   -Will trial patient with omeprazole 20 mg once daily.   RTC: 2 weeks for blood pressure follow-up. 3 months for hypertension follow-up.   Godfrey PickKimberly S. Tiburcio PeaHarris, MSN, FNP-C [facility]  [phone]  [address] [place], [place] [address] [phone]

## 2017-01-05 NOTE — Patient Instructions (Addendum)
Start Losartan 50 mg-12.5 mg daily for blood pressure control.  For tremors, start Propranolol 40 mg twice daily as needed for tremors.  You will be notified of lab results via phone.  Hypertension Hypertension is another name for high blood pressure. High blood pressure forces your heart to work harder to pump blood. This can cause problems over time. There are two numbers in a blood pressure reading. There is a top number (systolic) over a bottom number (diastolic). It is best to have a blood pressure below 120/80. Healthy choices can help lower your blood pressure. You may need medicine to help lower your blood pressure if:  Your blood pressure cannot be lowered with healthy choices.  Your blood pressure is higher than 130/80.  Follow these instructions at home: Eating and drinking  If directed, follow the DASH eating plan. This diet includes: ? Filling half of your plate at each meal with fruits and vegetables. ? Filling one quarter of your plate at each meal with whole grains. Whole grains include whole wheat pasta, brown rice, and whole grain bread. ? Eating or drinking low-fat dairy products, such as skim milk or low-fat yogurt. ? Filling one quarter of your plate at each meal with low-fat (lean) proteins. Low-fat proteins include fish, skinless chicken, eggs, beans, and tofu. ? Avoiding fatty meat, cured and processed meat, or chicken with skin. ? Avoiding premade or processed food.  Eat less than 1,500 mg of salt (sodium) a day.  Limit alcohol use to no more than 1 drink a day for nonpregnant women and 2 drinks a day for men. One drink equals 12 oz of beer, 5 oz of wine, or 1 oz of hard liquor. Lifestyle  Work with your doctor to stay at a healthy weight or to lose weight. Ask your doctor what the best weight is for you.  Get at least 30 minutes of exercise that causes your heart to beat faster (aerobic exercise) most days of the week. This may include walking, swimming, or  biking.  Get at least 30 minutes of exercise that strengthens your muscles (resistance exercise) at least 3 days a week. This may include lifting weights or pilates.  Do not use any products that contain nicotine or tobacco. This includes cigarettes and e-cigarettes. If you need help quitting, ask your doctor.  Check your blood pressure at home as told by your doctor.  Keep all follow-up visits as told by your doctor. This is important. Medicines  Take over-the-counter and prescription medicines only as told by your doctor. Follow directions carefully.  Do not skip doses of blood pressure medicine. The medicine does not work as well if you skip doses. Skipping doses also puts you at risk for problems.  Ask your doctor about side effects or reactions to medicines that you should watch for. Contact a doctor if:  You think you are having a reaction to the medicine you are taking.  You have headaches that keep coming back (recurring).  You feel dizzy.  You have swelling in your ankles.  You have trouble with your vision. Get help right away if:  You get a very bad headache.  You start to feel confused.  You feel weak or numb.  You feel faint.  You get very bad pain in your: ? Chest. ? Belly (abdomen).  You throw up (vomit) more than once.  You have trouble breathing. Summary  Hypertension is another name for high blood pressure.  Making healthy choices can help  lower blood pressure. If your blood pressure cannot be controlled with healthy choices, you may need to take medicine. This information is not intended to replace advice given to you by your health care provider. Make sure you discuss any questions you have with your health care provider. Document Released: 11/10/2007 Document Revised: 04/21/2016 Document Reviewed: 04/21/2016 Elsevier Interactive Patient Education  2018 [employer]   Dizziness Dizziness is a common problem. It makes you feel unsteady or  light-headed. You may feel like you are about to pass out (faint). Dizziness can lead to getting hurt if you stumble or fall. Dizziness can be caused by many things, including:  Medicines.  Not having enough water in your body (dehydration).  Illness.  Follow these instructions at home: Eating and drinking  Drink enough fluid to keep your pee (urine) clear or pale yellow. This helps to keep you from getting dehydrated. Try to drink more clear fluids, such as water.  Do not drink alcohol.  Limit how much caffeine you drink or eat, if your doctor tells you to do that.  Limit how much salt (sodium) you drink or eat, if your doctor tells you to do that. Activity  Avoid making quick movements. ? When you stand up from sitting in a chair, steady yourself until you feel okay. ? In the morning, first sit up on the side of the bed. When you feel okay, stand slowly while you hold onto something. Do this until you know that your balance is fine.  If you need to stand in one place for a long time, move your legs often. Tighten and relax the muscles in your legs while you are standing.  Do not drive or use heavy machinery if you feel dizzy.  Avoid bending down if you feel dizzy. Place items in your home so you can reach them easily without leaning over. Lifestyle  Do not use any products that contain nicotine or tobacco, such as cigarettes and e-cigarettes. If you need help quitting, ask your doctor.  Try to lower your stress level. You can do this by using methods such as yoga or meditation. Talk with your doctor if you need help. General instructions  Watch your dizziness for any changes.  Take over-the-counter and prescription medicines only as told by your doctor. Talk with your doctor if you think that you are dizzy because of a medicine that you are taking.  Tell a friend or a family member that you are feeling dizzy. If he or she notices any changes in your behavior, have this  person call your doctor.  Keep all follow-up visits as told by your doctor. This is important. Contact a doctor if:  Your dizziness does not go away.  Your dizziness or light-headedness gets worse.  You feel sick to your stomach (nauseous).  You have trouble hearing.  You have new symptoms.  You are unsteady on your feet.  You feel like the room is spinning. Get help right away if:  You throw up (vomit) or have watery poop (diarrhea), and you cannot eat or drink anything.  You have trouble: ? Talking. ? Walking. ? Swallowing. ? Using your arms, hands, or legs.  You feel generally weak.  You are not thinking clearly, or you have trouble forming sentences. A friend or family member may notice this.  You have: ? Chest pain. ? Pain in your belly (abdomen). ? Shortness of breath. ? Sweating.  Your vision changes.  You are bleeding.  You  have a very bad headache.  You have neck pain or a stiff neck.  You have a fever. These symptoms may be an emergency. Do not wait to see if the symptoms will go away. Get medical help right away. Call your local emergency services (911 in the [place] Do not drive yourself to the hospital. Summary  Dizziness makes you feel unsteady or light-headed. You may feel like you are about to pass out (faint).  Drink enough fluid to keep your pee (urine) clear or pale yellow. Do not drink alcohol.  Avoid making quick movements if you feel dizzy.  Watch your dizziness for any changes. This information is not intended to replace advice given to you by your health care provider. Make sure you discuss any questions you have with your health care provider. Document Released: 05/13/2011 Document Revised: 06/10/2016 Document Reviewed: 06/10/2016 Elsevier Interactive Patient Education  2017 [employer].

## 2017-01-06 LAB — COMPLETE METABOLIC PANEL WITH GFR
ALBUMIN: 4.2 g/dL (ref 3.6–5.1)
ALK PHOS: 84 U/L (ref 33–130)
ALT: 21 U/L (ref 6–29)
AST: 21 U/L (ref 10–35)
BUN: 17 mg/dL (ref 7–25)
CO2: 24 mmol/L (ref 20–31)
Calcium: 8.7 mg/dL (ref 8.6–10.4)
Chloride: 107 mmol/L (ref 98–110)
Creat: 0.8 mg/dL (ref 0.50–0.99)
GFR, EST NON AFRICAN AMERICAN: 79 mL/min (ref >=60)
GFR, Est African American: >89 mL/min (ref >=60)
GLUCOSE: 98 mg/dL (ref 65–99)
Potassium: 5.3 mmol/L (ref 3.5–5.3)
SODIUM: 140 mmol/L (ref 135–146)
Total Bilirubin: 0.6 mg/dL (ref 0.2–1.2)
Total Protein: 6.5 g/dL (ref 6.1–8.1)

## 2017-01-07 ENCOUNTER — Other Ambulatory Visit: Payer: Self-pay

## 2017-01-07 DIAGNOSIS — K219 Gastro-esophageal reflux disease without esophagitis: Secondary | ICD-10-CM

## 2017-01-10 ENCOUNTER — Other Ambulatory Visit: Payer: Self-pay

## 2017-01-10 DIAGNOSIS — K219 Gastro-esophageal reflux disease without esophagitis: Secondary | ICD-10-CM

## 2017-01-10 LAB — LYME ABY, WSTRN BLT IGG & IGM W/BANDS
B BURGDORFERI IGG ABS (IB): NEGATIVE
B BURGDORFERI IGM ABS (IB): NEGATIVE
LYME DISEASE 23 KD IGM: REACTIVE — AB
LYME DISEASE 30 KD IGG: NONREACTIVE
LYME DISEASE 39 KD IGG: NONREACTIVE
LYME DISEASE 41 KD IGG: NONREACTIVE
LYME DISEASE 45 KD IGG: NONREACTIVE
LYME DISEASE 58 KD IGG: NONREACTIVE
LYME DISEASE 66 KD IGG: NONREACTIVE
LYME DISEASE 93 KD IGG: NONREACTIVE
Lyme Disease 18 kD IgG: NONREACTIVE
Lyme Disease 23 kD IgG: NONREACTIVE
Lyme Disease 28 kD IgG: NONREACTIVE
Lyme Disease 39 kD IgM: NONREACTIVE
Lyme Disease 41 kD IgM: NONREACTIVE

## 2017-01-10 MED ORDER — OMEPRAZOLE 20 MG PO CPDR: 20.0000 mg | DELAYED_RELEASE_CAPSULE | Freq: Every day | ORAL | 3 refills | Status: DC

## 2017-01-13 NOTE — Addendum Note (Signed)
Addended by: Bing NeighborsHARRIS, Harshan Kearley S on: 01/13/2017 10:27 PM   Modules accepted: Orders

## 2017-01-19 ENCOUNTER — Encounter: Payer: Self-pay | Admitting: Physician Assistant
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
## 2017-01-20 ENCOUNTER — Encounter: Payer: Self-pay | Admitting: Physician Assistant

## 2017-01-20 ENCOUNTER — Encounter (INDEPENDENT_AMBULATORY_CARE_PROVIDER_SITE_OTHER): Payer: Self-pay

## 2017-01-20 ENCOUNTER — Ambulatory Visit (INDEPENDENT_AMBULATORY_CARE_PROVIDER_SITE_OTHER): Payer: Medicaid Other | Admitting: Physician Assistant

## 2017-01-20 VITALS — BP 154/82 | HR 72 | Ht 60.0 in | Wt 174.8 lb

## 2017-01-20 DIAGNOSIS — I10 Essential (primary) hypertension: Secondary | ICD-10-CM

## 2017-01-20 DIAGNOSIS — R5383 Other fatigue: Secondary | ICD-10-CM | POA: Diagnosis not present

## 2017-01-20 DIAGNOSIS — R0602 Shortness of breath: Secondary | ICD-10-CM

## 2017-01-20 DIAGNOSIS — G2581 Restless legs syndrome: Secondary | ICD-10-CM

## 2017-01-20 DIAGNOSIS — D6851 Activated protein C resistance: Secondary | ICD-10-CM

## 2017-01-20 DIAGNOSIS — R911 Solitary pulmonary nodule: Secondary | ICD-10-CM | POA: Diagnosis not present

## 2017-01-20 LAB — PRO B NATRIURETIC PEPTIDE: NT-Pro BNP: 43 pg/mL (ref 0–287)

## 2017-01-20 LAB — TSH: TSH: 1.36 u[IU]/mL (ref 0.450–4.500)

## 2017-01-20 LAB — FERRITIN: Ferritin: 150 ng/mL (ref 15–150)

## 2017-01-20 NOTE — Patient Instructions (Signed)
Medication Instructions:  Your physician recommends that you continue on your current medications as directed. Please refer to the Current Medication list given to you today.   Labwork: TODAY:  TSH, PRO BNP, & SERUM FERRITIN  Testing/Procedures: Your physician has recommended that you have a sleep study. This test records several body functions during sleep, including: brain activity, eye movement, oxygen and carbon dioxide blood levels, heart rate and rhythm, breathing rate and rhythm, the flow of air through your mouth and nose, snoring, body muscle movements, and chest and belly movement.    Follow-Up: Your physician recommends that you schedule a follow-up appointment in: 4-6 WEEKS WITH DR. Eden EmmsNISHAN OR HIS CAERTEAM  You have been referred to HEMATOLOGY, DR. Clyda GreenerKALE'S OFFICE.     Any Other Special Instructions Will Be Listed Below (If Applicable).  Sleep Studies A sleep study (polysomnogram) is a series of tests done while you are sleeping. It can show how well you sleep. This can help your health care provider diagnose a sleep disorder and show how severe your sleep disorder is. A sleep study may lead to treatment that will help you sleep better and prevent other medical problems caused by poor sleep. If you have a sleep disorder, you may also be at risk for:  Sleep-related accidents.  High blood pressure.  Heart disease.  Stroke.  Other medical conditions.  Sleep disorders are common. Your health care provider may suspect a sleep disorder if you:  Have loud snoring most nights.  Have brief periods when you stop breathing at night.  Feel sleepy on most days.  Fall asleep suddenly during the day.  Have trouble falling asleep or staying asleep.  Feel like you need to move your legs when trying to fall asleep.  Have dreams that seem very real shortly after falling asleep.  Feel like you cannot move when you first wake up.  Which tests will I need to have? Most sleep  studies last all night and include these tests:  Recordings of your brain activity.  Recordings of your eye movements.  Recording of your heart rate and rhythm.  Blood pressure readings.  Readings of the amount of oxygen in your blood.  Measurements of your chest and belly movement as you breathe during sleep.  If you have signs of the sleep disorder called sleep apnea during your test, you may get a mask to wear for the second half of the night.  The mask provides continuous positive airway pressure (CPAP). This may improve sleep apnea significantly.  You will then have all tests done again with the mask in place to see if your measurements and recordings change.  How are sleep studies done? Most sleep studies are done over one full night of sleep.  You will arrive at the study center in the evening and can go home in the morning.  Bring your pajamas and toothbrush.  Do not have caffeine on the day of your sleep study.  Your health care provider will let you know if you need to stop taking any of your regular medicines before the test.  To do the tests included in a polysomnogram, you will have:  Round, sticky patches with sensors attached to recording wires (electrodes) placed on your scalp, face, chest, and limbs.  Wires from all the electrodes and sensors run from your bed to a computer. The wires can be taken off and put back on if you need to get out of bed to go to the bathroom.  A sensor placed over your nose to measure airflow.  A finger clip put on one finger to measure your blood oxygen level.  A belt around your belly and a belt around your chest to measure breathing movements.  Where are sleep studies done? Sleep studies are done at sleep centers. A sleep center may be inside a hospital, office, or clinic. The room where you have the study may look like a hospital room or a hotel room. The health care providers doing the study may come in and out of the room  during the study. Most of the time, they will be in another room monitoring your test. How is information from sleep studies helpful? A polysomnogram can be used along with your medical history and a physical exam to diagnose conditions, such as:  Sleep apnea.  Restless legs syndrome.  Sleep-related seizure disorders.  Sleep-related movement disorders.  A medical doctor who specializes in sleep will evaluate your sleep study. The specialist will share the results with your primary health care provider. Treatments based on your sleep study may include:  Improving your sleep habits (sleep hygiene).  Wearing a CPAP mask.  Wearing an oral device at night to improve breathing and reduce snoring.  Taking medicine for: ? Restless legs syndrome. ? Sleep-related seizure disorder. ? Sleep-related movement disorder.  This information is not intended to replace advice given to you by your health care provider. Make sure you discuss any questions you have with your health care provider. Document Released: 11/28/2002 Document Revised: 01/18/2016 Document Reviewed: 07/30/2013 Elsevier Interactive Patient Education  [employer].    If you need a refill on your cardiac medications before your next appointment, please call your pharmacy.

## 2017-01-20 NOTE — Addendum Note (Signed)
Addended by: Burnetta SabinWITTY, Christia Domke K on: 01/20/2017 11:23 AM   Modules accepted: Orders

## 2017-01-21 ENCOUNTER — Telehealth: Payer: Self-pay | Admitting: *Deleted

## 2017-01-21 MED ORDER — PROPRANOLOL HCL ER 60 MG PO CP24: 60.0000 mg | ORAL_CAPSULE | Freq: Every day | ORAL | 1 refills | Status: DC

## 2017-01-21 NOTE — Telephone Encounter (Signed)
-----   Message from Laurann Montana, [place] sent at 01/20/2017  4:45 PM EDT ----- Please let patient know labs were normal, including test for heart failure which is good news. She was previously instructed to take short acting propranolol for tremors, which helped. Lets restart it but the long acting version - Propranolol LA 60mg  daily. Dayna Dunn PA-C

## 2017-01-31 ENCOUNTER — Ambulatory Visit: Payer: Medicaid Other | Admitting: Family Medicine

## 2017-02-02 ENCOUNTER — Telehealth: Payer: Self-pay | Admitting: *Deleted

## 2017-02-02 NOTE — Telephone Encounter (Signed)
Informed patient of upcoming sleep study and patient understanding was verbalized. Patient understands her sleep study is scheduled for Sunday March 13 2017. Patient understands [facility]. Patient understands she will receive a sleep packet in a week or so. Patient understands to call if she does not receive the sleep packet in a timely manner. Patient agrees with treatment and thanked me for call.

## 2017-02-03 ENCOUNTER — Telehealth: Payer: Self-pay | Admitting: Hematology

## 2017-02-03 ENCOUNTER — Encounter: Payer: Self-pay | Admitting: Hematology

## 2017-02-03 NOTE — Telephone Encounter (Signed)
Appt has been scheduled for the pt to see Dr. Candise CheKale on 9/18 at 11am. Pt aware to arrive 30 minutes early. Address and insurance verified.   I noticed an appt had been scheduled for the pt to see Dr. Candise CheKale on 07/26/16, but when I looked in the chart to make sure she wasn't established, there wasn't a progress note for the visit. The pt stated that she didn't recall coming to our office either. I scheduled the pt as a new patient since she hadn't been to our office.

## 2017-02-05 ENCOUNTER — Other Ambulatory Visit: Payer: Self-pay | Admitting: Neurology

## 2017-02-22 ENCOUNTER — Encounter: Payer: Self-pay | Admitting: Hematology

## 2017-02-23 ENCOUNTER — Ambulatory Visit: Payer: Medicaid Other | Admitting: Nurse Practitioner
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
## 2017-02-25 ENCOUNTER — Encounter: Payer: Self-pay | Admitting: Nurse Practitioner

## 2017-03-07 ENCOUNTER — Ambulatory Visit: Payer: Medicaid Other | Admitting: Family Medicine

## 2017-03-13 ENCOUNTER — Ambulatory Visit (HOSPITAL_BASED_OUTPATIENT_CLINIC_OR_DEPARTMENT_OTHER): Payer: Medicaid Other | Attending: Physician Assistant

## 2017-04-16 ENCOUNTER — Other Ambulatory Visit: Payer: Self-pay | Admitting: Neurology

## 2017-05-14 ENCOUNTER — Other Ambulatory Visit: Payer: Self-pay | Admitting: Family Medicine

## 2017-05-14 DIAGNOSIS — K219 Gastro-esophageal reflux disease without esophagitis: Secondary | ICD-10-CM

## 2017-07-13 ENCOUNTER — Other Ambulatory Visit: Payer: Self-pay | Admitting: Physician Assistant

## 2017-08-13 ENCOUNTER — Other Ambulatory Visit: Payer: Self-pay | Admitting: Family Medicine

## 2017-08-30 ENCOUNTER — Other Ambulatory Visit: Payer: Self-pay | Admitting: Family Medicine

## 2017-09-13 ENCOUNTER — Ambulatory Visit: Payer: Medicaid Other | Admitting: Family Medicine

## 2021-02-27 ENCOUNTER — Other Ambulatory Visit: Payer: Self-pay | Admitting: Physician Assistant

## 2021-02-27 DIAGNOSIS — R200 Anesthesia of skin: Secondary | ICD-10-CM

## 2021-02-27 DIAGNOSIS — R202 Paresthesia of skin: Secondary | ICD-10-CM

## 2021-02-27 DIAGNOSIS — R251 Tremor, unspecified: Secondary | ICD-10-CM

## 2021-02-27 DIAGNOSIS — R490 Dysphonia: Secondary | ICD-10-CM

## 2021-02-27 DIAGNOSIS — M797 Fibromyalgia: Secondary | ICD-10-CM

## 2021-02-27 DIAGNOSIS — R208 Other disturbances of skin sensation: Secondary | ICD-10-CM

## 2021-03-31 ENCOUNTER — Ambulatory Visit (HOSPITAL_COMMUNITY): Admission: RE | Admit: 2021-03-31 | Payer: Medicare HMO | Source: Ambulatory Visit

## 2021-04-21 ENCOUNTER — Ambulatory Visit
Admission: RE | Admit: 2021-04-21 | Discharge: 2021-04-21 | Disposition: A | Discharge status: Home / Self Care | Payer: Medicare HMO | Source: Ambulatory Visit | Attending: Physician Assistant | Admitting: Physician Assistant

## 2021-04-21 ENCOUNTER — Other Ambulatory Visit: Payer: Self-pay

## 2021-04-21 DIAGNOSIS — M797 Fibromyalgia: Secondary | ICD-10-CM | POA: Diagnosis present

## 2021-04-21 DIAGNOSIS — R208 Other disturbances of skin sensation: Secondary | ICD-10-CM | POA: Diagnosis present

## 2021-04-21 DIAGNOSIS — R490 Dysphonia: Secondary | ICD-10-CM | POA: Insufficient documentation

## 2021-04-21 DIAGNOSIS — R200 Anesthesia of skin: Secondary | ICD-10-CM | POA: Insufficient documentation

## 2021-04-21 DIAGNOSIS — R251 Tremor, unspecified: Secondary | ICD-10-CM | POA: Diagnosis present

## 2021-04-21 DIAGNOSIS — R202 Paresthesia of skin: Secondary | ICD-10-CM | POA: Diagnosis present

## 2021-04-21 MED ORDER — GADOBUTROL 1 MMOL/ML IV SOLN
7.0000 mL | Freq: Once | INTRAVENOUS | Status: AC | PRN
  Administered 2021-04-21: 7 mL via INTRAVENOUS

## 2021-05-22 LAB — COLOGUARD: COLOGUARD: NEGATIVE

## 2023-10-28 IMAGING — MR MR HEAD WO/W CM
13 series · 48 of 48 positions shown · IV contrast (gadavist)
Comparison: 07/20/2016.  06/20/2015.

CLINICAL DATA: Numbness and tingling of the forehead beginning 3-4
years ago, recently generalized to the entire head.

EXAM:
MRI HEAD WITHOUT AND WITH CONTRAST
TECHNIQUE: Multiplanar, multiecho pulse sequences of the brain and surrounding
structures were obtained without and with intravenous contrast.
CONTRAST:  7mL GADAVIST GADOBUTROL 1 MMOL/ML IV SOLN

[Series 5: ax dwi_tracew · axial · 3.0mm · 0.65mm/px · z∈[-134,+15]mm · 4 of 48 slices shown]
[im 1/48]
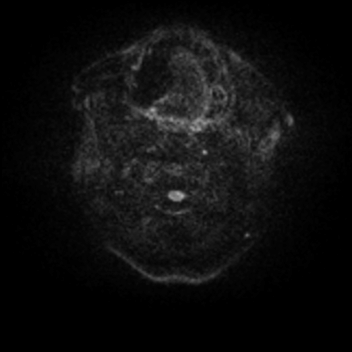
[im 16/48]
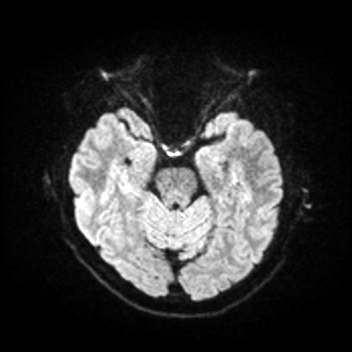
[im 32/48]
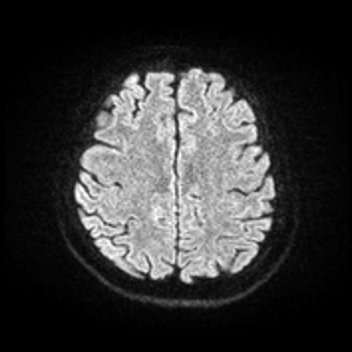
[im 48/48]
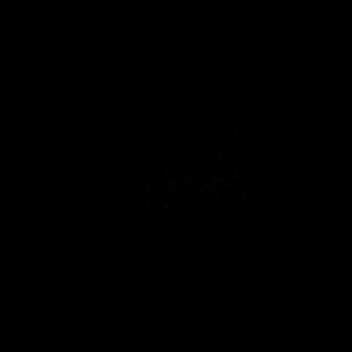

[Series 6: ax dwi_adc · axial · 3.0mm · 0.65mm/px · z∈[-134,+12]mm · 3 of 47 slices shown]
[im 1/47]
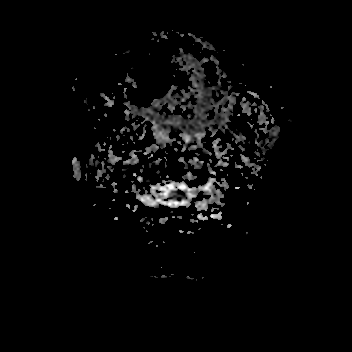
[im 24/47]
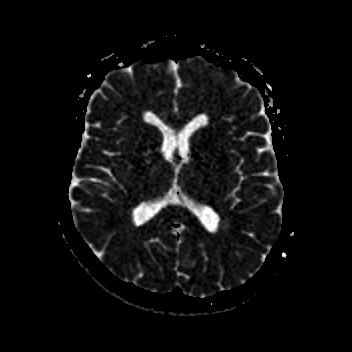
[im 47/47]
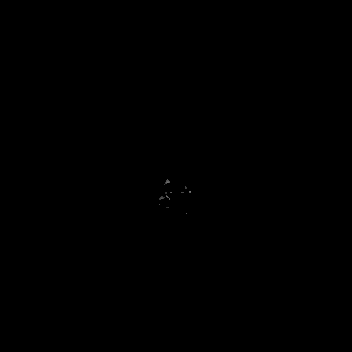

[Series 7: cor dwi_tracew · coronal · 5.0mm · 0.65mm/px · 2 of 39 slices shown]
[im 1/39]
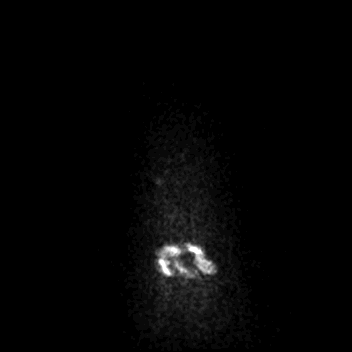
[im 39/39]
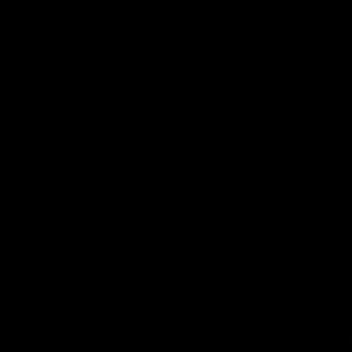

[Series 8: cor dwi_adc · coronal · 5.0mm · 0.65mm/px · 2 of 38 slices shown]
[im 1/38]
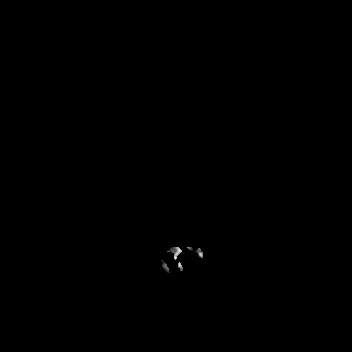
[im 38/38]
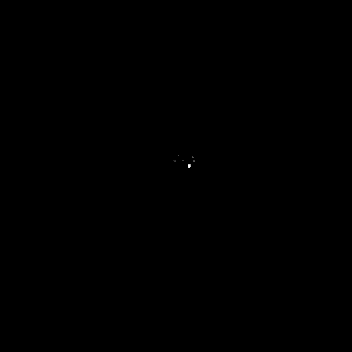

[Series 9: T1 · sagittal · 5.0mm · 0.62mm/px · 1 of 25 slices shown (1 of 2)]
[im 1/25]
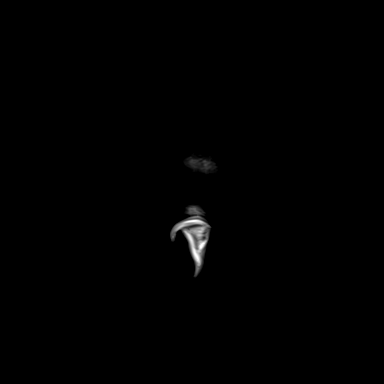

[Series 10: T2 · axial · 5.0mm · 0.53mm/px · z∈[-130,+14]mm · 2 of 26 slices shown]
[im 1/26]
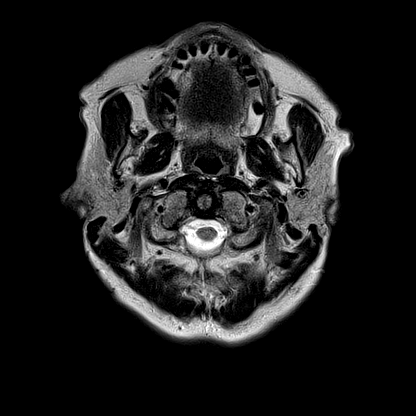
[im 26/26]
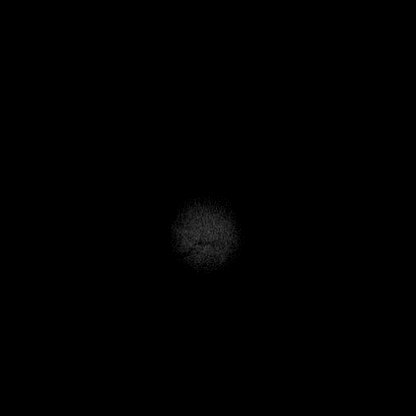

[Series 12: pha_images · axial · 3.0mm · 0.90mm/px · z∈[-144,+17]mm · 3 of 57 slices shown]
[im 1/57]
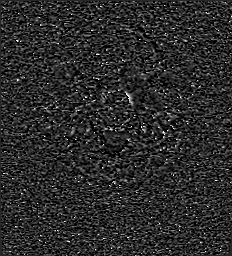
[im 29/57]
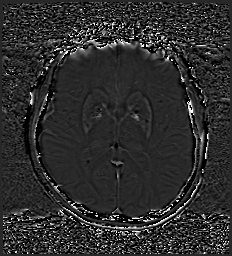
[im 57/57]
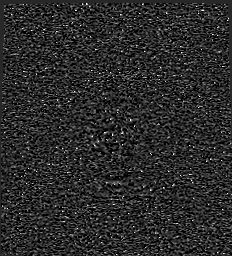

[Series 13: swi_images · axial · 3.0mm · 0.90mm/px · z∈[-144,+26]mm · 4 of 60 slices shown]
[im 1/60]
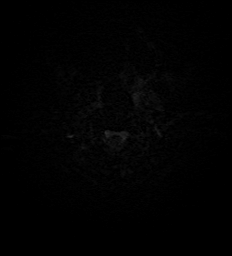
[im 20/60]
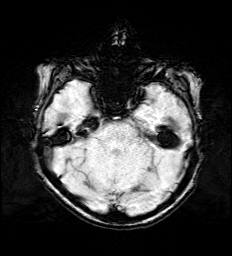
[im 40/60]
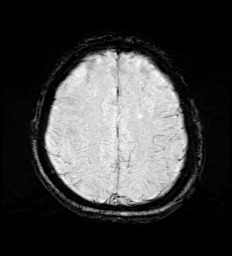
[im 60/60]
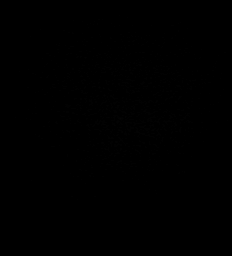

[Series 15: FLAIR · axial · 3.0mm · 0.53mm/px · z∈[-136,+20]mm · 3 of 55 slices shown]
[im 1/55]
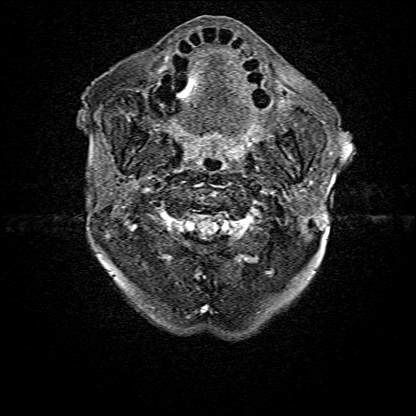
[im 28/55]
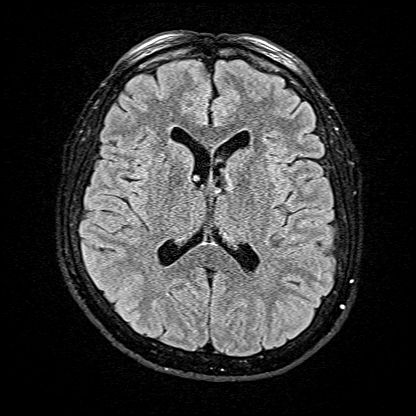
[im 55/55]
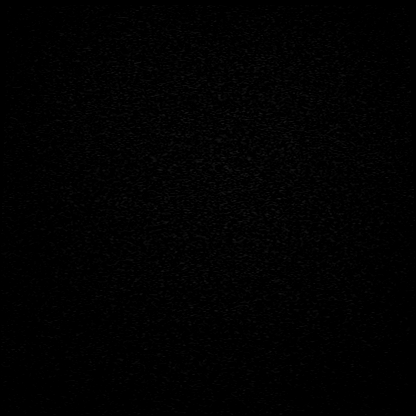

[Series 16: T1 · axial · 1.0mm · 0.98mm/px · z∈[-146,+22]mm · 10 of 176 slices shown (2 of 2)]
[im 1/176]
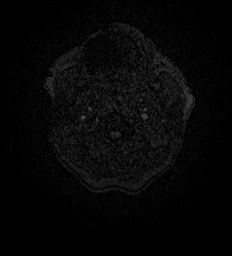
[im 20/176]
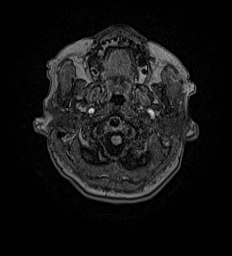
[im 39/176]
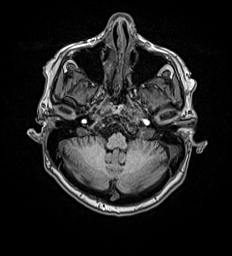
[im 59/176]
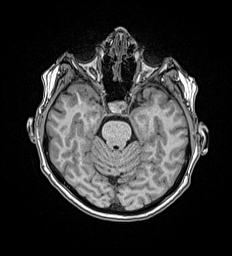
[im 78/176]
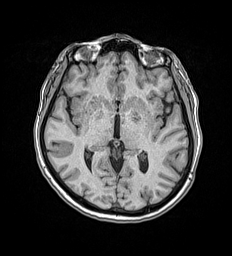
[im 98/176]
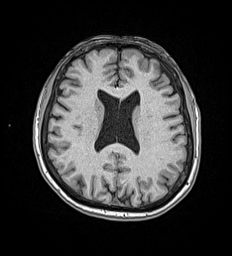
[im 117/176]
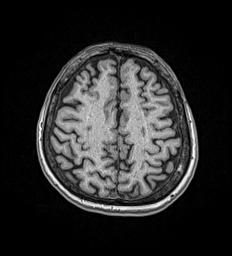
[im 137/176]
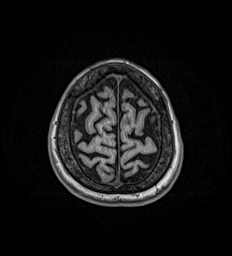
[im 156/176]
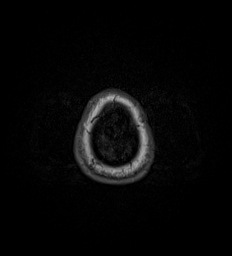
[im 176/176]
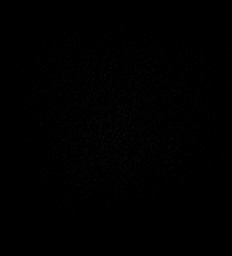

[Series 17: T2 post-contrast · coronal · 5.0mm · 0.57mm/px · 2 of 29 slices shown]
[im 1/29]
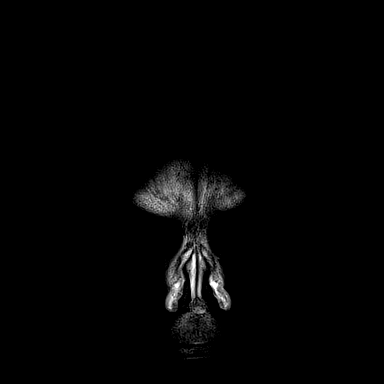
[im 29/29]
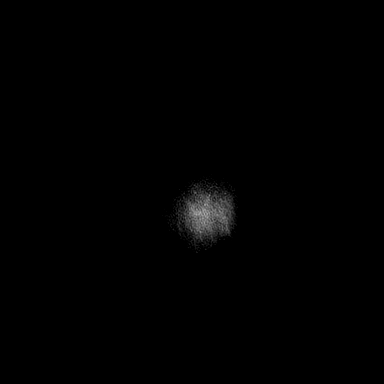

[Series 18: T1 post-contrast · axial · 1.0mm · 0.98mm/px · z∈[-146,+22]mm · 10 of 176 slices shown (1 of 2)]
[im 1/176]
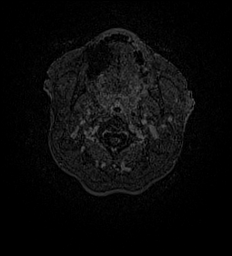
[im 20/176]
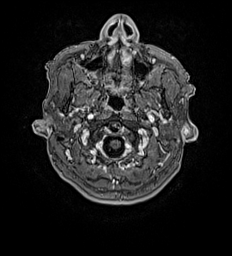
[im 39/176]
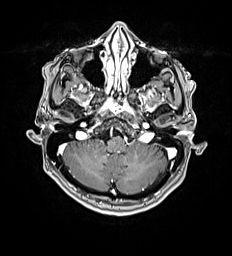
[im 59/176]
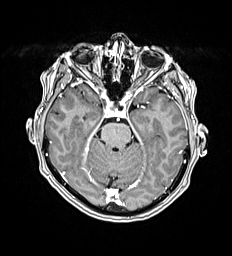
[im 78/176]
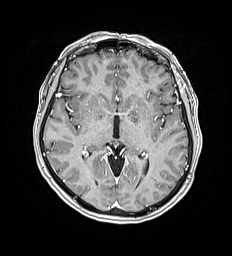
[im 98/176]
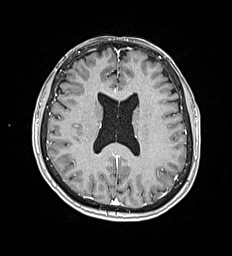
[im 117/176]
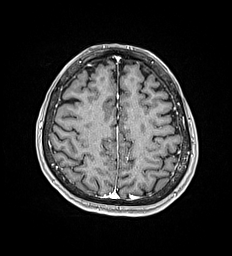
[im 137/176]
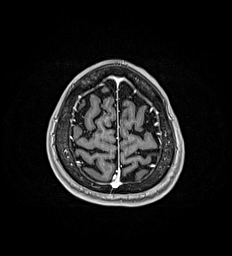
[im 156/176]
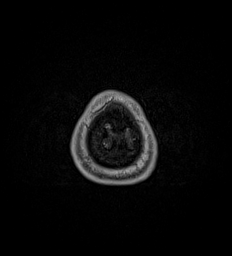
[im 176/176]
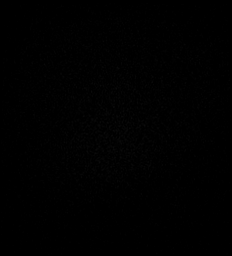

[Series 19: T1 post-contrast · coronal · 5.0mm · 0.57mm/px · 2 of 29 slices shown (2 of 2)]
[im 1/29]
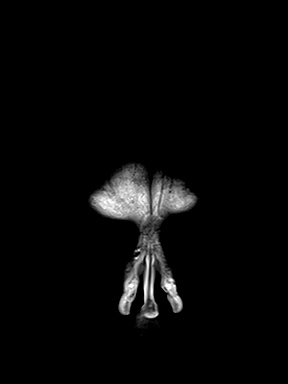
[im 29/29]
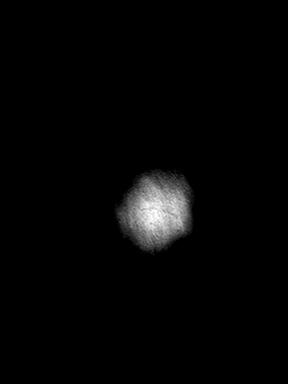

[48 of 48 positions shown; findings below may reference images not displayed]

FINDINGS: Brain: Diffusion imaging does not show any acute or subacute
infarction. No focal abnormality affects the brainstem or
cerebellum. There are chronic dilated perivascular spaces of the
globus palladi. Single focus of abnormal T2 and FLAIR signal within
the left frontal deep white matter. No cortical or large vessel
territory infarction. No mass lesion, hemorrhage, hydrocephalus or
extra-axial collection. After contrast administration, no abnormal
enhancement occurs.

Vascular: Major vessels at the base of the brain show flow.

Skull and upper cervical spine: Negative

Sinuses/Orbits: Clear/normal

Other: None
IMPRESSION: No change since 2805. Single subcentimeter focus of T2 and FLAIR
signal within the left frontal white matter. As an isolated finding,
demyelinating disease could be considered but the finding is not
conclusive. Chronic dilated perivascular spaces of the globus
palladi as seen previously, not significant.
